# Patient Record
Sex: Male | Born: 1980 | Race: White | Hispanic: No | Marital: Single | Smoking: Current every day smoker
Health system: Southern US, Community
[De-identification: ages and names within clinical notes are randomized; demographics above are authoritative.]

## PROBLEM LIST (undated history)

## (undated) DIAGNOSIS — K859 Acute pancreatitis without necrosis or infection, unspecified: Secondary | ICD-10-CM

## (undated) DIAGNOSIS — M549 Dorsalgia, unspecified: Secondary | ICD-10-CM

## (undated) DIAGNOSIS — J45909 Unspecified asthma, uncomplicated: Secondary | ICD-10-CM

## (undated) DIAGNOSIS — G8929 Other chronic pain: Secondary | ICD-10-CM

## (undated) DIAGNOSIS — I499 Cardiac arrhythmia, unspecified: Secondary | ICD-10-CM

## (undated) DIAGNOSIS — K529 Noninfective gastroenteritis and colitis, unspecified: Secondary | ICD-10-CM

## (undated) HISTORY — DX: Other chronic pain: G89.29

## (undated) HISTORY — DX: Cardiac arrhythmia, unspecified: I49.9

## (undated) HISTORY — DX: Noninfective gastroenteritis and colitis, unspecified: K52.9

## (undated) HISTORY — DX: Dorsalgia, unspecified: M54.9

---

## 2000-09-30 HISTORY — PX: WISDOM TOOTH EXTRACTION: SHX21

## 2005-08-24 ENCOUNTER — Emergency Department: Payer: Self-pay | Admitting: Emergency Medicine

## 2005-08-27 ENCOUNTER — Emergency Department: Payer: Self-pay | Admitting: Emergency Medicine

## 2011-07-03 ENCOUNTER — Ambulatory Visit: Payer: Self-pay | Admitting: Gastroenterology

## 2011-08-07 ENCOUNTER — Ambulatory Visit: Payer: Self-pay | Admitting: Gastroenterology

## 2011-08-11 LAB — PATHOLOGY REPORT

## 2011-10-03 ENCOUNTER — Emergency Department: Payer: Self-pay | Admitting: Emergency Medicine

## 2011-11-07 ENCOUNTER — Ambulatory Visit: Payer: Self-pay | Admitting: Family Medicine

## 2011-11-11 ENCOUNTER — Other Ambulatory Visit: Payer: Self-pay | Admitting: Emergency Medicine

## 2011-11-11 LAB — LIPID PANEL
Cholesterol: 153 mg/dL (ref 0–200)
HDL Cholesterol: 30 mg/dL — ABNORMAL LOW (ref 40–60)
Ldl Cholesterol, Calc: 102 mg/dL — ABNORMAL HIGH (ref 0–100)
Triglycerides: 104 mg/dL (ref 0–200)
VLDL Cholesterol, Calc: 21 mg/dL (ref 5–40)

## 2011-11-12 LAB — CBC WITH DIFFERENTIAL/PLATELET
Basophil #: 0 10*3/uL (ref 0.0–0.1)
Basophil %: 0.5 %
Eosinophil #: 0.3 10*3/uL (ref 0.0–0.7)
Eosinophil %: 3.9 %
HCT: 46.1 % (ref 40.0–52.0)
HGB: 15.1 g/dL (ref 13.0–18.0)
Lymphocyte #: 2.7 10*3/uL (ref 1.0–3.6)
Lymphocyte %: 34.4 %
MCH: 29.8 pg (ref 26.0–34.0)
MCHC: 32.9 g/dL (ref 32.0–36.0)
MCV: 91 fL (ref 80–100)
Monocyte #: 0.4 10*3/uL (ref 0.0–0.7)
Monocyte %: 5.5 %
Neutrophil #: 4.4 10*3/uL (ref 1.4–6.5)
Neutrophil %: 55.7 %
Platelet: 171 10*3/uL (ref 150–440)
RBC: 5.09 10*6/uL (ref 4.40–5.90)
RDW: 13.9 % (ref 11.5–14.5)
WBC: 7.8 10*3/uL (ref 3.8–10.6)

## 2011-11-12 LAB — COMPREHENSIVE METABOLIC PANEL
Albumin: 4.3 g/dL (ref 3.4–5.0)
Alkaline Phosphatase: 60 U/L (ref 50–136)
Anion Gap: 7 (ref 7–16)
BUN: 13 mg/dL (ref 7–18)
Bilirubin,Total: 0.4 mg/dL (ref 0.2–1.0)
Calcium, Total: 9.1 mg/dL (ref 8.5–10.1)
Chloride: 106 mmol/L (ref 98–107)
Co2: 28 mmol/L (ref 21–32)
Creatinine: 1.17 mg/dL (ref 0.60–1.30)
EGFR (African American): >60
EGFR (Non-African Amer.): >60
Glucose: 83 mg/dL (ref 65–99)
Osmolality: 281 (ref 275–301)
Potassium: 4.3 mmol/L (ref 3.5–5.1)
SGOT(AST): 12 U/L — ABNORMAL LOW (ref 15–37)
SGPT (ALT): 16 U/L
Sodium: 141 mmol/L (ref 136–145)
Total Protein: 7.4 g/dL (ref 6.4–8.2)

## 2012-02-24 ENCOUNTER — Emergency Department: Payer: Self-pay | Admitting: Emergency Medicine

## 2012-03-11 ENCOUNTER — Emergency Department: Payer: Self-pay | Admitting: Emergency Medicine

## 2012-09-10 ENCOUNTER — Emergency Department: Payer: Self-pay | Admitting: Emergency Medicine

## 2012-11-05 ENCOUNTER — Ambulatory Visit: Payer: Self-pay | Admitting: Family

## 2012-12-29 ENCOUNTER — Other Ambulatory Visit: Payer: Self-pay | Admitting: Family

## 2013-01-31 ENCOUNTER — Emergency Department: Payer: Self-pay | Admitting: Internal Medicine

## 2013-02-03 ENCOUNTER — Ambulatory Visit: Payer: Self-pay | Admitting: Urgent Care

## 2013-02-03 LAB — COMPREHENSIVE METABOLIC PANEL
Alkaline Phosphatase: 60 U/L (ref 50–136)
Anion Gap: 6 — ABNORMAL LOW (ref 7–16)
BUN: 11 mg/dL (ref 7–18)
Calcium, Total: 8.2 mg/dL — ABNORMAL LOW (ref 8.5–10.1)
Co2: 32 mmol/L (ref 21–32)
Creatinine: 1.06 mg/dL (ref 0.60–1.30)
Glucose: 86 mg/dL (ref 65–99)
Potassium: 3.3 mmol/L — ABNORMAL LOW (ref 3.5–5.1)
SGOT(AST): 10 U/L — ABNORMAL LOW (ref 15–37)
SGPT (ALT): 18 U/L (ref 12–78)
Total Protein: 6.4 g/dL (ref 6.4–8.2)

## 2013-02-03 LAB — CBC WITH DIFFERENTIAL/PLATELET
Basophil #: 0.1 10*3/uL (ref 0.0–0.1)
Eosinophil #: 0.1 10*3/uL (ref 0.0–0.7)
Eosinophil %: 1 %
Lymphocyte #: 4.7 10*3/uL — ABNORMAL HIGH (ref 1.0–3.6)
MCH: 29.5 pg (ref 26.0–34.0)
MCHC: 33.2 g/dL (ref 32.0–36.0)
MCV: 89 fL (ref 80–100)
Monocyte #: 0.9 x10 3/mm (ref 0.2–1.0)
Neutrophil #: 6.2 10*3/uL (ref 1.4–6.5)
Neutrophil %: 51.7 %
Platelet: 177 10*3/uL (ref 150–440)
RBC: 4.58 10*6/uL (ref 4.40–5.90)
WBC: 12 10*3/uL — ABNORMAL HIGH (ref 3.8–10.6)

## 2013-02-03 LAB — LIPASE, BLOOD: Lipase: 63 U/L — ABNORMAL LOW (ref 73–393)

## 2013-02-11 ENCOUNTER — Ambulatory Visit: Payer: Self-pay | Admitting: Gastroenterology

## 2013-03-30 ENCOUNTER — Other Ambulatory Visit: Payer: Self-pay | Admitting: Gastroenterology

## 2013-04-01 ENCOUNTER — Ambulatory Visit: Payer: Self-pay | Admitting: Gastroenterology

## 2013-04-26 ENCOUNTER — Ambulatory Visit: Payer: Self-pay | Admitting: Surgery

## 2013-04-26 ENCOUNTER — Ambulatory Visit: Payer: Self-pay | Admitting: Family

## 2013-04-27 LAB — CLOSTRIDIUM DIFFICILE BY PCR

## 2013-04-28 LAB — WBCS, STOOL

## 2013-05-03 ENCOUNTER — Other Ambulatory Visit: Payer: Self-pay | Admitting: Physician Assistant

## 2013-05-06 ENCOUNTER — Ambulatory Visit: Payer: Self-pay | Admitting: Internal Medicine

## 2013-05-20 ENCOUNTER — Emergency Department: Payer: Self-pay | Admitting: Emergency Medicine

## 2013-05-20 LAB — CBC
HCT: 44 % (ref 40.0–52.0)
HGB: 15.3 g/dL (ref 13.0–18.0)
Platelet: 208 10*3/uL (ref 150–440)
RBC: 5 10*6/uL (ref 4.40–5.90)
WBC: 14.9 10*3/uL — ABNORMAL HIGH (ref 3.8–10.6)

## 2013-05-20 LAB — URINALYSIS, COMPLETE
Bacteria: NONE SEEN
Bilirubin,UR: NEGATIVE
Leukocyte Esterase: NEGATIVE
Nitrite: NEGATIVE
Ph: 6 (ref 4.5–8.0)
RBC,UR: <1 /HPF (ref 0–5)
Squamous Epithelial: <1

## 2013-05-20 LAB — BASIC METABOLIC PANEL
BUN: 9 mg/dL (ref 7–18)
Calcium, Total: 9.3 mg/dL (ref 8.5–10.1)
Co2: 25 mmol/L (ref 21–32)
Creatinine: 1.12 mg/dL (ref 0.60–1.30)
Glucose: 97 mg/dL (ref 65–99)
Sodium: 137 mmol/L (ref 136–145)

## 2013-06-10 IMAGING — CT CT ABD-PELV W/ CM
1 of 2 series · 15 of 32 positions shown, 19 images · non-contrast
Comparison: none

REASON FOR EXAM: abd pain and weight loss
COMMENTS:

[Series 2: abd with 5.0 i40f 3 · axial · 0.85mm/px · z∈[-588,-118]mm · 15 of 104 slices shown, 19 images]
[im 5/104  soft-tissue]
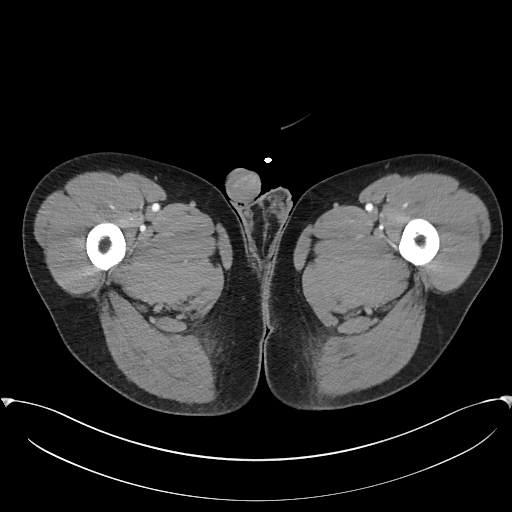
[im 5/104  bone]
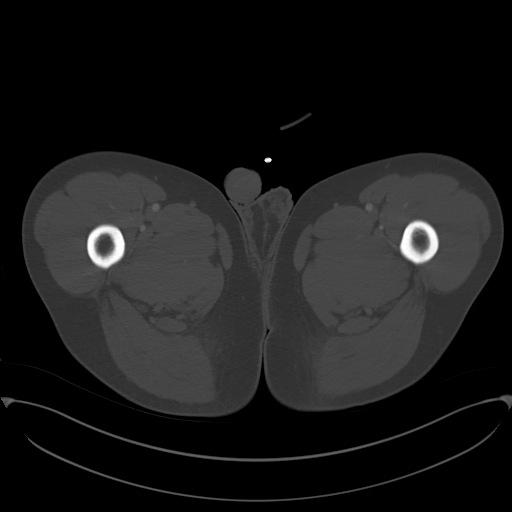
[im 13/104  soft-tissue]
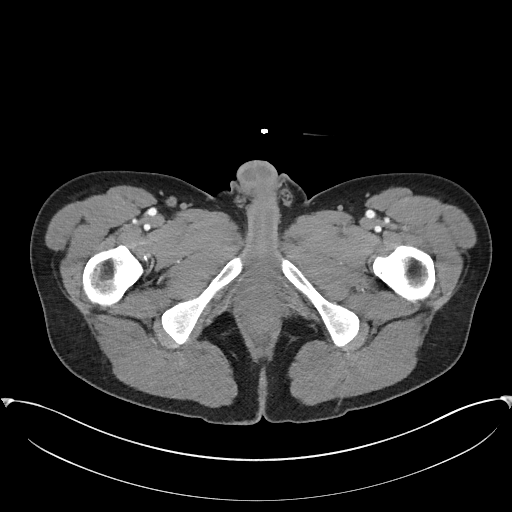
[im 22/104  soft-tissue]
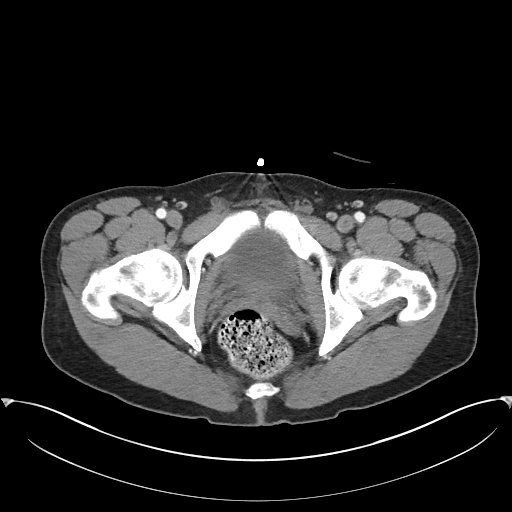
[im 31/104  soft-tissue]
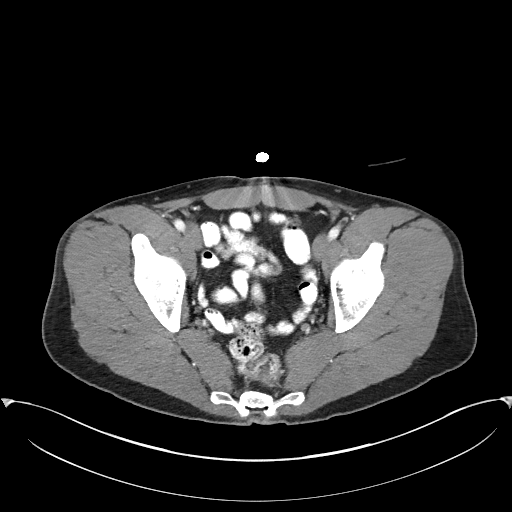
[im 35/104  soft-tissue]
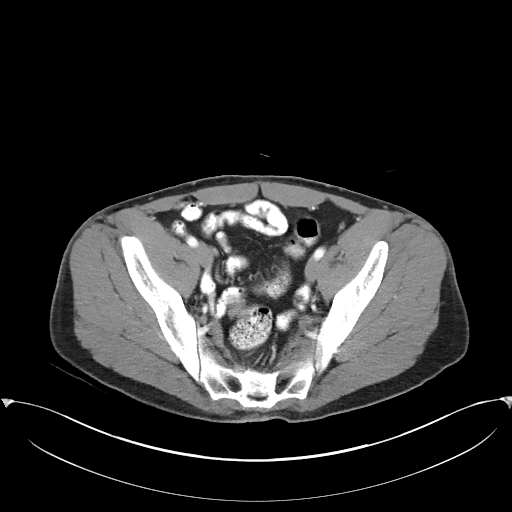
[im 43/104  soft-tissue]
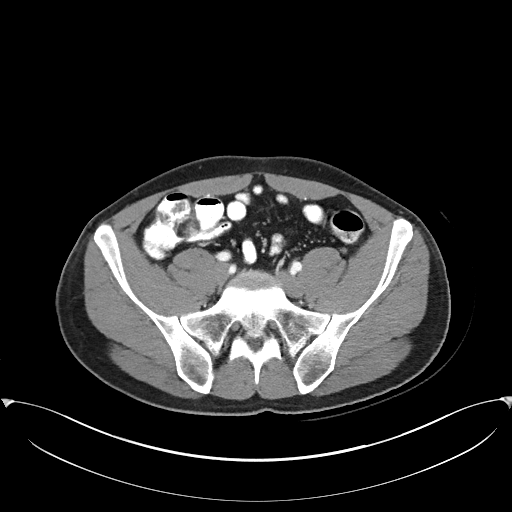
[im 52/104  soft-tissue]
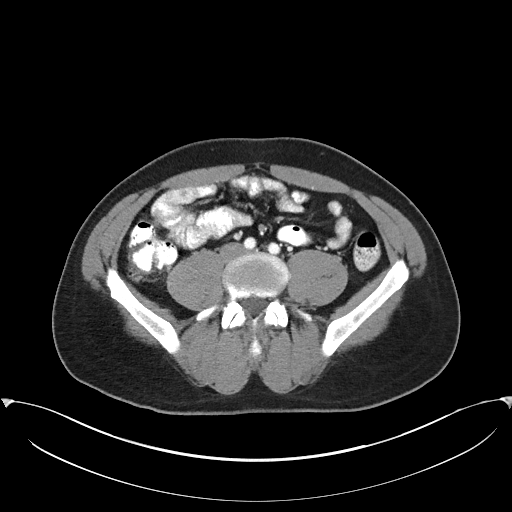
[im 61/104  soft-tissue]
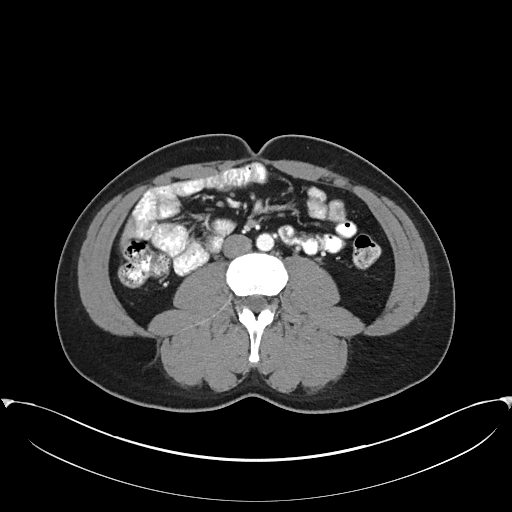
[im 69/104  soft-tissue]
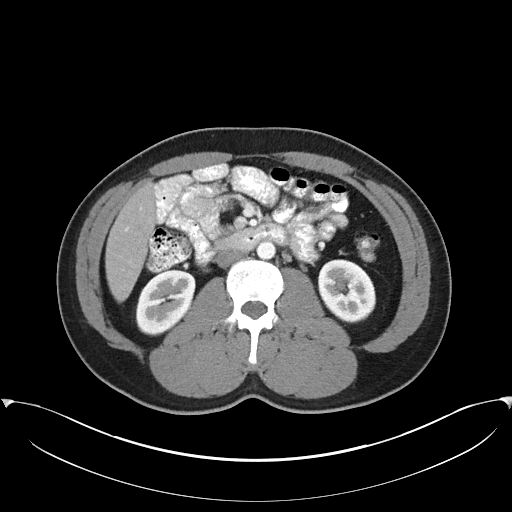
[im 69/104  bone]
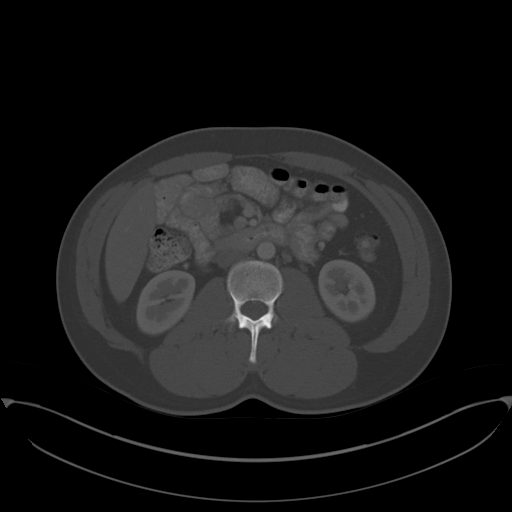
[im 73/104  soft-tissue]
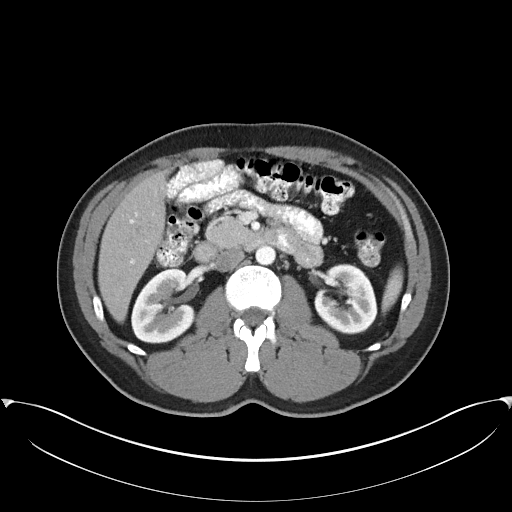
[im 82/104  soft-tissue]
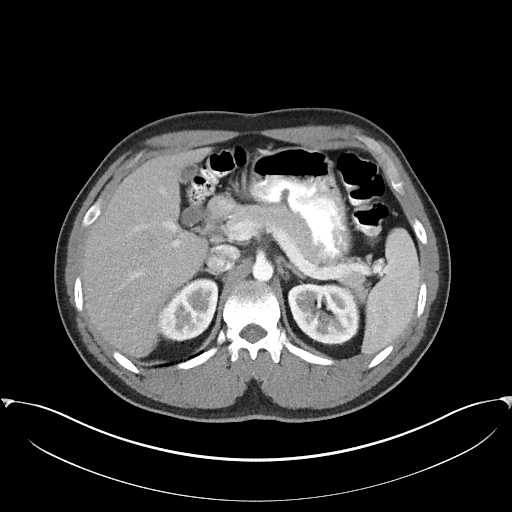
[im 86/104  lung]
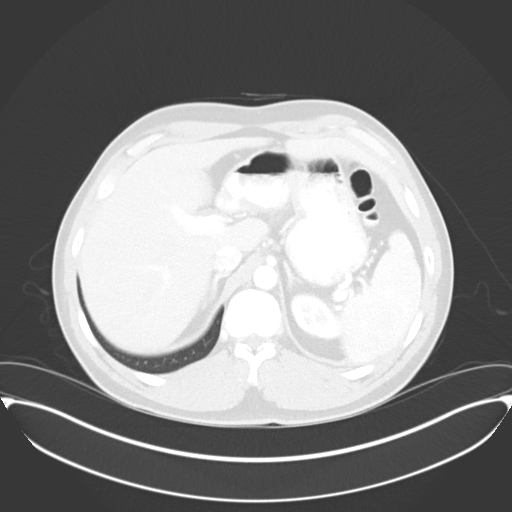
[im 91/104  soft-tissue]
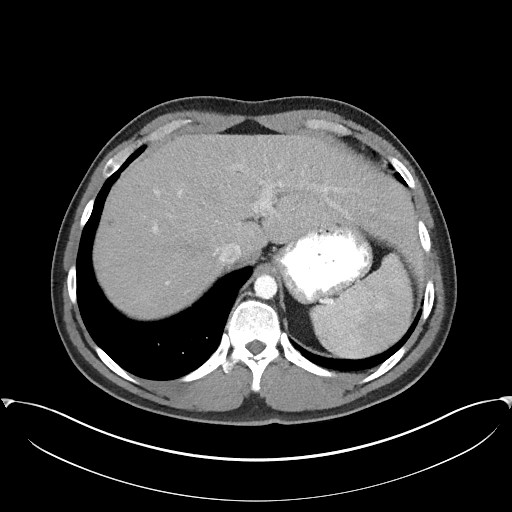
[im 91/104  lung]
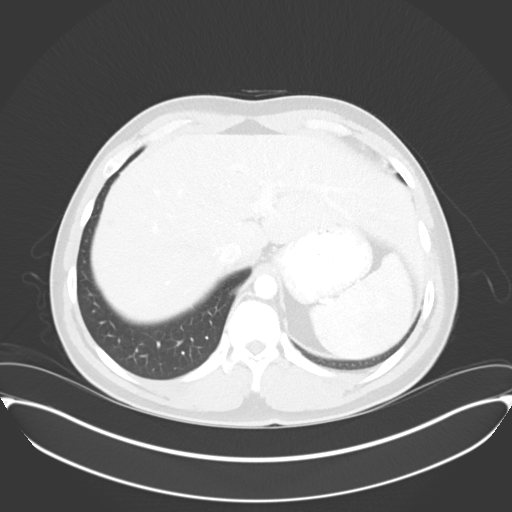
[im 95/104  lung]
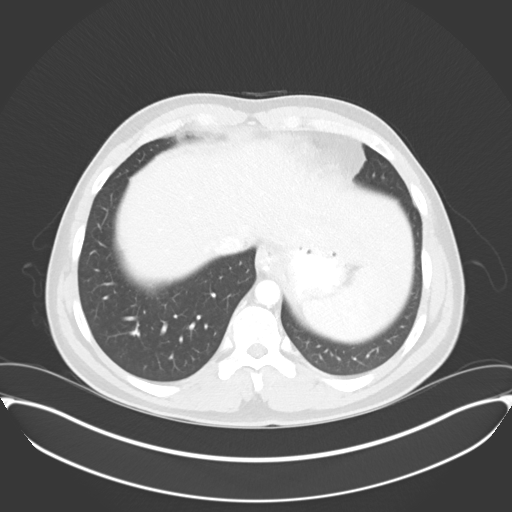
[im 99/104  soft-tissue]
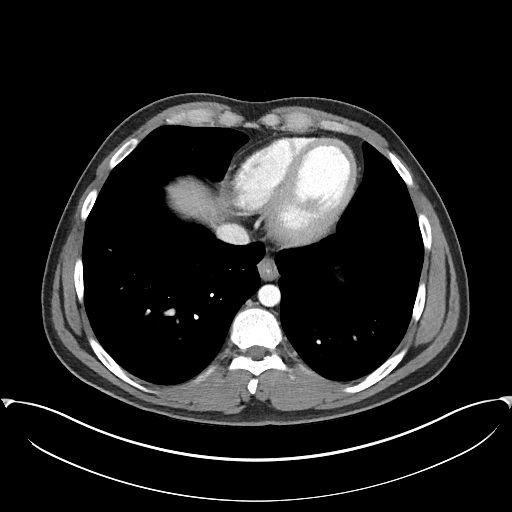
[im 99/104  lung]
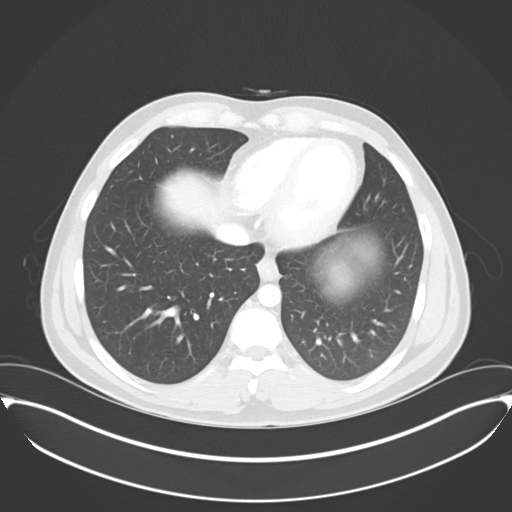

[15 of 32 positions shown; findings below may reference images not displayed]

PROCEDURE:     KCT - KCT ABDOMEN/PELVIS W  - July 03, 2011 [DATE]

RESULT:     CT of the abdomen and pelvis is performed with 100 mL of
[id number] iodinated intravenous contrast and oral contrast. Images are
reconstructed at 5.0 mm slice thickness in the axial plane. The patient has
no previous exam for comparison.

Images through the base of the lungs demonstrate normal aeration without a
discrete mass. There is no pleural or pericardial effusion. The liver,
spleen, pancreas, adrenal glands and kidneys appear to be grossly normal. No
definite gallstones are seen. The adrenal glands show no discrete mass. The
aorta is normal in caliber. The urinary bladder is unremarkable. The bowel
shows no abnormal distention to suggest obstruction. There is no ascites or
pneumoperitoneum. The bony structures appear grossly normal. No adenopathy
is evident.
IMPRESSION: 1.     No focal abdominal or pelvic abnormality. The pancreas is homogeneous
in attenuation without a well-defined mass.

## 2013-07-07 ENCOUNTER — Emergency Department: Payer: Self-pay | Admitting: Emergency Medicine

## 2013-07-07 LAB — COMPREHENSIVE METABOLIC PANEL
BUN: 10 mg/dL (ref 7–18)
Calcium, Total: 8.7 mg/dL (ref 8.5–10.1)
Chloride: 109 mmol/L — ABNORMAL HIGH (ref 98–107)
Co2: 27 mmol/L (ref 21–32)
Creatinine: 1.05 mg/dL (ref 0.60–1.30)
EGFR (African American): >60
EGFR (Non-African Amer.): >60
SGOT(AST): 10 U/L — ABNORMAL LOW (ref 15–37)
SGPT (ALT): 14 U/L (ref 12–78)
Total Protein: 6.5 g/dL (ref 6.4–8.2)

## 2013-07-07 LAB — CBC
MCHC: 34.9 g/dL (ref 32.0–36.0)
RBC: 4.49 10*6/uL (ref 4.40–5.90)

## 2013-07-27 ENCOUNTER — Ambulatory Visit: Payer: Self-pay | Admitting: Family

## 2015-03-20 ENCOUNTER — Encounter: Payer: Self-pay | Admitting: Urgent Care

## 2015-03-20 ENCOUNTER — Emergency Department
Admission: EM | Admit: 2015-03-20 | Discharge: 2015-03-21 | Disposition: A | Discharge status: Home / Self Care | Payer: Self-pay | Attending: Emergency Medicine | Admitting: Emergency Medicine

## 2015-03-20 DIAGNOSIS — L03115 Cellulitis of right lower limb: Secondary | ICD-10-CM | POA: Insufficient documentation

## 2015-03-20 HISTORY — DX: Unspecified asthma, uncomplicated: J45.909

## 2015-03-20 NOTE — ED Notes (Signed)
Patient presents with c/o RIGHT ankle wound. Reports that he "stabbed a rusty portion of metal" in ankle x 2 weeks ago. Area was healing until the last couple of days - area red with (+) purulent discharge.

## 2015-03-21 MED ORDER — CEPHALEXIN 500 MG PO CAPS
ORAL_CAPSULE | ORAL | Status: AC
  Filled 2015-03-21: qty 1

## 2015-03-21 MED ORDER — CEPHALEXIN 500 MG PO CAPS: 500.0000 mg | ORAL_CAPSULE | Freq: Two times a day (BID) | ORAL | Status: DC

## 2015-03-21 MED ORDER — CEPHALEXIN 500 MG PO CAPS
500.0000 mg | ORAL_CAPSULE | Freq: Once | ORAL | Status: AC
  Administered 2015-03-21: 500 mg via ORAL

## 2015-03-21 NOTE — ED Provider Notes (Signed)
[facility] Emergency Department Provider Note  ____________________________________________  Time seen: 12:04 AM  I have reviewed the triage vital signs and the nursing notes.   HISTORY  Chief Complaint Wound Infection      HPI Randall Shelton is a 34 y.o. male presents with nonhealing wound to the lateral portion of the right ankle 2 weeks. Patient states despite applying antibiotic ointment and peroxide is nonhealing     Past Medical History  Diagnosis Date  . Asthma     There are no active problems to display for this patient.   History reviewed. No pertinent past surgical history.  No current outpatient prescriptions on file.  Allergies Review of patient's allergies indicates no known allergies.  No family history on file.  Social History History  Substance Use Topics  . Smoking status: Never Smoker   . Smokeless tobacco: Not on file  . Alcohol Use: Yes    Review of Systems  Constitutional: Negative for fever. Eyes: Negative for visual changes. ENT: Negative for sore throat. Cardiovascular: Negative for chest pain. Respiratory: Negative for shortness of breath. Gastrointestinal: Negative for abdominal pain, vomiting and diarrhea. Genitourinary: Negative for dysuria. Musculoskeletal: Negative for back pain. Skin: Positive cellulitis right ankle Neurological: Negative for headaches, focal weakness or numbness.   10-point ROS otherwise negative.  ____________________________________________   PHYSICAL EXAM:  VITAL SIGNS: ED Triage Vitals  Enc Vitals Group     BP 03/20/15 2317 114/66 mmHg     Pulse Rate 03/20/15 2317 65     Resp 03/20/15 2317 16     Temp 03/20/15 2317 98 F (36.7 C)     Temp Source 03/20/15 2317 Oral     SpO2 03/20/15 2317 96 %     Weight 03/20/15 2317 180 lb (81.647 kg)     Height 03/20/15 2317  (1.854 m)     Head Cir --      Peak Flow --      Pain Score 03/20/15 2318 2     Pain Loc --       Pain Edu? --      Excl. in GC? --      Constitutional: Alert and oriented. Well appearing and in no distress. Eyes: Conjunctivae are normal. PERRL. Normal extraocular movements. ENT   Head: Normocephalic and atraumatic.   Nose: No congestion/rhinnorhea.   Mouth/Throat: Mucous membranes are moist.   Neck: No stridor. Hematological/Lymphatic/Immunilogical: No cervical lymphadenopathy. Cardiovascular: Normal rate, regular rhythm. Normal and symmetric distal pulses are present in all extremities. No murmurs, rubs, or gallops. Respiratory: Normal respiratory effort without tachypnea nor retractions. Breath sounds are clear and equal bilaterally. No wheezes/rales/rhonchi. Gastrointestinal: Soft and nontender. No distention. There is no CVA tenderness. Genitourinary: deferred Musculoskeletal: Nontender with normal range of motion in all extremities. No joint effusions.  No lower extremity tenderness nor edema. Neurologic:  Normal speech and language. No gross focal neurologic deficits are appreciated. Speech is normal.  Skin:  Skin is warm, dry and intact. No rash noted. Psychiatric: Mood and affect are normal. Speech and behavior are normal. Patient exhibits appropriate insight and judgment.       ____________________________________________   INITIAL IMPRESSION / ASSESSMENT AND PLAN / ED COURSE  Pertinent labs & imaging results that were available during my care of the patient were reviewed by me and considered in my medical decision making (see chart for details).    ____________________________________________   FINAL CLINICAL IMPRESSION(S) / ED DIAGNOSES  Final diagnoses:  Cellulitis of right ankle      Darci Current, MD 03/21/15 [id number]

## 2015-03-21 NOTE — Discharge Instructions (Signed)

## 2015-03-21 NOTE — ED Notes (Signed)
Pt ambulating independently w/ steady gait on d/c in no acute distress, A&Ox4. D/c instructions reviewed w/ pt and family - pt and family deny any further questions or concerns at present. Rx given x1  

## 2015-04-05 ENCOUNTER — Emergency Department
Admission: EM | Admit: 2015-04-05 | Discharge: 2015-04-05 | Disposition: A | Discharge status: Home / Self Care | Payer: Self-pay | Attending: Emergency Medicine | Admitting: Emergency Medicine

## 2015-04-05 ENCOUNTER — Encounter: Payer: Self-pay | Admitting: General Practice

## 2015-04-05 ENCOUNTER — Emergency Department: Payer: Self-pay

## 2015-04-05 DIAGNOSIS — R110 Nausea: Secondary | ICD-10-CM | POA: Insufficient documentation

## 2015-04-05 DIAGNOSIS — Z792 Long term (current) use of antibiotics: Secondary | ICD-10-CM | POA: Insufficient documentation

## 2015-04-05 DIAGNOSIS — K611 Rectal abscess: Secondary | ICD-10-CM | POA: Insufficient documentation

## 2015-04-05 DIAGNOSIS — Z720 Tobacco use: Secondary | ICD-10-CM | POA: Insufficient documentation

## 2015-04-05 HISTORY — DX: Acute pancreatitis without necrosis or infection, unspecified: K85.90

## 2015-04-05 LAB — URINALYSIS COMPLETE WITH MICROSCOPIC (ARMC ONLY)
BILIRUBIN URINE: NEGATIVE
Bacteria, UA: NONE SEEN
Glucose, UA: NEGATIVE mg/dL
HGB URINE DIPSTICK: NEGATIVE
KETONES UR: NEGATIVE mg/dL
LEUKOCYTES UA: NEGATIVE
Nitrite: NEGATIVE
PH: 7 (ref 5.0–8.0)
Protein, ur: NEGATIVE mg/dL
SQUAMOUS EPITHELIAL / LPF: NONE SEEN
Specific Gravity, Urine: 1.02 (ref 1.005–1.030)

## 2015-04-05 LAB — CBC WITH DIFFERENTIAL/PLATELET
Basophils Absolute: 0.1 10*3/uL (ref 0–0.1)
Basophils Relative: 1 %
Eosinophils Absolute: 0.5 10*3/uL (ref 0–0.7)
Eosinophils Relative: 4 %
HCT: 42.9 % (ref 40.0–52.0)
HEMOGLOBIN: 14.4 g/dL (ref 13.0–18.0)
Lymphocytes Relative: 19 %
Lymphs Abs: 2.6 10*3/uL (ref 1.0–3.6)
MCH: 29.8 pg (ref 26.0–34.0)
MCHC: 33.6 g/dL (ref 32.0–36.0)
MCV: 88.7 fL (ref 80.0–100.0)
MONOS PCT: 7 %
Monocytes Absolute: 1 10*3/uL (ref 0.2–1.0)
NEUTROS ABS: 10 10*3/uL — AB (ref 1.4–6.5)
NEUTROS PCT: 69 %
Platelets: 197 10*3/uL (ref 150–440)
RBC: 4.83 MIL/uL (ref 4.40–5.90)
RDW: 13.9 % (ref 11.5–14.5)
WBC: 14.2 10*3/uL — ABNORMAL HIGH (ref 3.8–10.6)

## 2015-04-05 LAB — COMPREHENSIVE METABOLIC PANEL
ALT: 9 U/L — ABNORMAL LOW (ref 17–63)
AST: 13 U/L — AB (ref 15–41)
Albumin: 4.1 g/dL (ref 3.5–5.0)
Alkaline Phosphatase: 58 U/L (ref 38–126)
Anion gap: 5 (ref 5–15)
BILIRUBIN TOTAL: 0.5 mg/dL (ref 0.3–1.2)
BUN: 17 mg/dL (ref 6–20)
CALCIUM: 8.1 mg/dL — AB (ref 8.9–10.3)
CO2: 24 mmol/L (ref 22–32)
CREATININE: 1.1 mg/dL (ref 0.61–1.24)
Chloride: 105 mmol/L (ref 101–111)
GFR calc non Af Amer: >60 mL/min (ref >60)
Glucose, Bld: 119 mg/dL — ABNORMAL HIGH (ref 65–99)
Potassium: 3.6 mmol/L (ref 3.5–5.1)
Sodium: 134 mmol/L — ABNORMAL LOW (ref 135–145)
Total Protein: 7.2 g/dL (ref 6.5–8.1)

## 2015-04-05 LAB — LIPASE, BLOOD: LIPASE: 39 U/L (ref 22–51)

## 2015-04-05 MED ORDER — IOHEXOL 240 MG/ML SOLN
25.0000 mL | Freq: Once | INTRAMUSCULAR | Status: AC | PRN
  Administered 2015-04-05: 25 mL via ORAL
  Filled 2015-04-05: qty 25

## 2015-04-05 MED ORDER — SODIUM CHLORIDE 0.9 % IV BOLUS (SEPSIS)
1000.0000 mL | Freq: Once | INTRAVENOUS | Status: AC
  Administered 2015-04-05: 1000 mL via INTRAVENOUS

## 2015-04-05 MED ORDER — MORPHINE SULFATE 4 MG/ML IJ SOLN
4.0000 mg | Freq: Once | INTRAMUSCULAR | Status: AC
  Administered 2015-04-05: 4 mg via INTRAVENOUS

## 2015-04-05 MED ORDER — CIPROFLOXACIN HCL 500 MG PO TABS: 500.0000 mg | ORAL_TABLET | Freq: Two times a day (BID) | ORAL | Status: DC

## 2015-04-05 MED ORDER — METRONIDAZOLE 500 MG PO TABS
ORAL_TABLET | ORAL | Status: AC
  Administered 2015-04-05: 500 mg via ORAL
  Filled 2015-04-05: qty 1

## 2015-04-05 MED ORDER — METRONIDAZOLE 500 MG PO TABS
500.0000 mg | ORAL_TABLET | Freq: Once | ORAL | Status: AC
  Administered 2015-04-05: 500 mg via ORAL

## 2015-04-05 MED ORDER — MORPHINE SULFATE 4 MG/ML IJ SOLN
INTRAMUSCULAR | Status: AC
  Administered 2015-04-05: 4 mg via INTRAVENOUS
  Filled 2015-04-05: qty 1

## 2015-04-05 MED ORDER — ONDANSETRON HCL 4 MG/2ML IJ SOLN
INTRAMUSCULAR | Status: AC
  Administered 2015-04-05: 4 mg via INTRAVENOUS
  Filled 2015-04-05: qty 2

## 2015-04-05 MED ORDER — CIPROFLOXACIN HCL 500 MG PO TABS
500.0000 mg | ORAL_TABLET | Freq: Once | ORAL | Status: AC
  Administered 2015-04-05: 500 mg via ORAL

## 2015-04-05 MED ORDER — METRONIDAZOLE 500 MG PO TABS: 500.0000 mg | ORAL_TABLET | Freq: Three times a day (TID) | ORAL | Status: AC

## 2015-04-05 MED ORDER — IOHEXOL 300 MG/ML  SOLN
100.0000 mL | Freq: Once | INTRAMUSCULAR | Status: AC | PRN
  Administered 2015-04-05: 100 mL via INTRAVENOUS
  Filled 2015-04-05: qty 100

## 2015-04-05 MED ORDER — CIPROFLOXACIN HCL 500 MG PO TABS
ORAL_TABLET | ORAL | Status: AC
  Administered 2015-04-05: 500 mg via ORAL
  Filled 2015-04-05: qty 1

## 2015-04-05 MED ORDER — IOHEXOL 240 MG/ML SOLN: 25.0000 mL | Freq: Once | INTRAMUSCULAR | Status: DC | PRN

## 2015-04-05 MED ORDER — ONDANSETRON HCL 4 MG/2ML IJ SOLN
4.0000 mg | Freq: Once | INTRAMUSCULAR | Status: AC
  Administered 2015-04-05: 4 mg via INTRAVENOUS

## 2015-04-05 NOTE — ED Notes (Signed)
Pt. Arrived to ed from home with reports of experiencing lower abdominal pain and nausea. Pt alert and oriented. Denies vomiting and diarrhea. Pt. Describes pain as cramping sensation.

## 2015-04-05 NOTE — H&P (Signed)
CC: Lower abdominal pain x 1 day  HPI: Mr. Lawerance CruelOgle is a pleasant 34 yo M who presents with 1 day of lower abdominal pain, R>L.  Began acutely.  Did not resolve.  No pain with BM.  + feels subjectively hot.  Feels better now since here.  No chills, night sweats, shortness of breath, cough, chest pain, nausea/vomiting, diarrhea/constipation, dysuria/hematuria.  Has CT which shows small perirectal abscess, nonsuperficial.  Active Ambulatory Problems    Diagnosis Date Noted  . No Active Ambulatory Problems   Resolved Ambulatory Problems    Diagnosis Date Noted  . No Resolved Ambulatory Problems   Past Medical History  Diagnosis Date  . Asthma   . Pancreatitis    History   Social History  . Marital Status: Single    Spouse Name: N/A  . Number of Children: N/A  . Years of Education: N/A   Occupational History  . Not on file.   Social History Main Topics  . Smoking status: Current Every Day Smoker -- 1.00 packs/day    Types: Cigarettes  . Smokeless tobacco: Never Used  . Alcohol Use: Yes  . Drug Use: Yes    Special: Marijuana  . Sexual Activity: Not on file   Other Topics Concern  . Not on file   Social History Narrative   Home Meds: None  No Known Allergies   ROS: Full ROS obtained, pertinent positives and negatives as above  Blood pressure 137/66, pulse 89, temperature 98.9 F (37.2 C), temperature source Oral, resp. rate 18, height 6\' 1"  (1.854 m), weight 81.647 kg (180 lb), SpO2 97 %. GEN: NAD/A&Ox3 FACE: no obvious facial trauma, normal external nose, normal external ears EYES: no scleral icterus, no conjunctivitis HEAD: normocephalic atraumatic CV: RRR, no MRG RESP: moving air well, lungs clear ABD: soft, nontender, nondistended RECTAL: No obvious rectal tenderness, masses, fluctuance noted on digital exam EXT: moving all ext well, strength 5/5 NEURO: cnII-XII grossly intact, sensation intact all 4 ext  Labs: reviewed, significant for WBC 14.2 with 69%  neutrophils  CT: small right perirectal abscess, ? Above levators  A/P 34 yo M who presents with lower abdominal pain, CT shows small perirectal abscess.  Pain significantly improved now.  Have given patient option of admission for IV abx vs admission for IV abx and rectal EUA with possible drainage vs PO abx and outpatient tx with close follow up and to call me if things worsen.  As he feels better, he would like to go home with attempt of outpatient therapy.

## 2015-04-05 NOTE — Discharge Instructions (Signed)
Peri-Rectal Abscess  Your caregiver has diagnosed you as having a peri-rectal abscess. This is an infected area near the rectum that is filled with pus. If the abscess is near the surface of the skin, your caregiver may open (incise) the area and drain the pus.  HOME CARE INSTRUCTIONS    If your abscess was opened up and drained. A small piece of gauze may be placed in the opening so that it can drain. Do not remove the gauze unless directed by your caregiver.   A loose dressing may be placed over the abscess site. Change the dressing as often as necessary to keep it clean and dry.   After the drain is removed, the area may be washed with a gentle antiseptic (soap) four times per day.   A warm sitz bath, warm packs or heating pad may be used for pain relief, taking care not to burn yourself.   Return for a wound check in 1 day or as directed.   An "inflatable doughnut" may be used for sitting with added comfort. These can be purchased at a drugstore or medical supply house.   To reduce pain and straining with bowel movements, eat a high fiber diet with plenty of fruits and vegetables. Use stool softeners as recommended by your caregiver. This is especially important if narcotic type pain medications were prescribed as these may cause marked constipation.   Only take over-the-counter or prescription medicines for pain, discomfort, or fever as directed by your caregiver.  SEEK IMMEDIATE MEDICAL CARE IF:    You have increasing pain that is not controlled by medication.   There is increased inflammation (redness), swelling, bleeding, or drainage from the area.   An oral temperature above 102 F (38.9 C) develops.   You develop chills or generalized malaise (feel lethargic or feel "washed out").   You develop any new symptoms (problems) you feel may be related to your present problem.  Document Released: 09/13/2000 Document Revised: 12/09/2011 Document Reviewed: 09/13/2008  [employer] Patient Information  2015 [employer]. This information is not intended to replace advice given to you by your health care provider. Make sure you discuss any questions you have with your health care provider.

## 2015-04-05 NOTE — ED Provider Notes (Signed)
[facility] Emergency Department Provider Note  ____________________________________________  Time seen: Approximately 7:48 PM  I have reviewed the triage vital signs and the nursing notes.   HISTORY  Chief Complaint Abdominal Pain and Nausea    HPI Randall Shelton is a 34 y.o. male with a history of pancreatitis presents with right lower quadrant abdominal pain which has now spread to the abdomen diffusely. He has been experienced nausea today as well. No diarrhea. No penile or testicular pain.No dysuria. Pain is moderate and cramping. No diarrhea. No recent sick contacts.   Past Medical History  Diagnosis Date  . Asthma   . Pancreatitis     There are no active problems to display for this patient.   History reviewed. No pertinent past surgical history.  Current Outpatient Rx  Name  Route  Sig  Dispense  Refill  . cephALEXin (KEFLEX) 500 MG capsule   Oral   Take 1 capsule (500 mg total) by mouth 2 (two) times daily.   14 capsule   0     Allergies Review of patient's allergies indicates no known allergies.  No family history on file.  Social History History  Substance Use Topics  . Smoking status: Current Every Day Smoker -- 1.00 packs/day    Types: Cigarettes  . Smokeless tobacco: Never Used  . Alcohol Use: Yes    Review of Systems Constitutional: No fever/chills Eyes: No visual changes. ENT: No sore throat. Cardiovascular: Denies chest pain. Respiratory: Denies shortness of breath. Gastrointestinal: no vomiting.  No diarrhea.  No constipation. Genitourinary: Negative for dysuria. Musculoskeletal: Negative for back pain. Skin: Negative for rash. Neurological: Negative for headaches, focal weakness or numbness.  10-point ROS otherwise negative.  ____________________________________________   PHYSICAL EXAM:  VITAL SIGNS: ED Triage Vitals  Enc Vitals Group     BP 04/05/15 1845 137/66 mmHg     Pulse Rate 04/05/15 1845 89      Resp 04/05/15 1845 18     Temp 04/05/15 1845 98.9 F (37.2 C)     Temp Source 04/05/15 1845 Oral     SpO2 04/05/15 1845 97 %     Weight 04/05/15 1845 180 lb (81.647 kg)     Height 04/05/15 1845  (1.854 m)     Head Cir --      Peak Flow --      Pain Score 04/05/15 1848 8     Pain Loc --      Pain Edu? --      Excl. in GC? --     Constitutional: Alert and oriented. Well appearing and in no acute distress. Eyes: Conjunctivae are normal. PERRL. EOMI. Head: Atraumatic. Nose: No congestion/rhinnorhea. Mouth/Throat: Mucous membranes are moist.  Oropharynx non-erythematous. Neck: No stridor.   Cardiovascular: Normal rate, regular rhythm. Grossly normal heart sounds.  Good peripheral circulation. Respiratory: Normal respiratory effort.  No retractions. Lungs CTAB. Gastrointestinal: Soft with pain worse in the right lower quadrant. No rebound or guarding. No distention. No abdominal bruits. No CVA tenderness. Rectal exam with ringlike mass 1-2 inches proximal to the anal verge. Mild fluctuance. It is tender to palpation over this lesion. Musculoskeletal: No lower extremity tenderness nor edema.  No joint effusions. Neurologic:  Normal speech and language. No gross focal neurologic deficits are appreciated. Speech is normal. No gait instability. Skin:  Skin is warm, dry and intact. No rash noted. Psychiatric: Mood and affect are normal. Speech and behavior are normal.  ____________________________________________   LABS (  all labs ordered are listed, but only abnormal results are displayed)  Labs Reviewed  COMPREHENSIVE METABOLIC PANEL - Abnormal; Notable for the following:    Sodium 134 (*)    Glucose, Bld 119 (*)    Calcium 8.1 (*)    AST 13 (*)    ALT 9 (*)    All other components within normal limits  CBC WITH DIFFERENTIAL/PLATELET - Abnormal; Notable for the following:    WBC 14.2 (*)    Neutro Abs 10.0 (*)    All other components within normal limits  URINALYSIS  COMPLETEWITH MICROSCOPIC ([facility] ONLY) - Abnormal; Notable for the following:    Color, Urine STRAW (*)    APPearance CLEAR (*)    All other components within normal limits  LIPASE, BLOOD   ____________________________________________  EKG   ____________________________________________  RADIOLOGY  Rim enhancing fluid collection along the right side of the rectum is most consistent with a perirectal abscess. ____________________________________________   PROCEDURES    ____________________________________________   INITIAL IMPRESSION / ASSESSMENT AND PLAN / ED COURSE  Pertinent labs & imaging results that were available during my care of the patient were reviewed by me and considered in my medical decision making (see chart for details).  ----------------------------------------- 11:09 PM on 04/05/2015 -----------------------------------------  Patient resting comfortably at this time after pain medication. Dr. Juliann PulseLundquist was in the emergency department to evaluate this patient and would like to do a trial of outpatient antibiotics. Discussed with the patient who agrees to this trial. Dr. Juliann PulseLundquist said that trends rectal drainage may result in a fistula and a trial of by mouth antibiotics may be advantageous to avoid complication. Patient knows to call tomorrow for follow-up with Dr. Juliann PulseLundquist in the office. After telling the patient that he had a perirectal abscess, the patient says that he has had multiple rectal abscesses but usually not internally. Says that he has had multiple externally which she has had incised and drained. Denies any IV drug use. Denies putting anything inside the rectum. ____________________________________________   FINAL CLINICAL IMPRESSION(S) / ED DIAGNOSES  Acute perirectal abscess. Initial visit.    Myrna Blazeravid Matthew Liliya Fullenwider, MD 04/05/15 [phone]

## 2016-10-21 ENCOUNTER — Emergency Department: Payer: Self-pay

## 2016-10-21 ENCOUNTER — Encounter: Payer: Self-pay | Admitting: *Deleted

## 2016-10-21 ENCOUNTER — Emergency Department
Admission: EM | Admit: 2016-10-21 | Discharge: 2016-10-21 | Disposition: A | Discharge status: Home / Self Care | Payer: Self-pay | Attending: Emergency Medicine | Admitting: Emergency Medicine

## 2016-10-21 DIAGNOSIS — F1290 Cannabis use, unspecified, uncomplicated: Secondary | ICD-10-CM | POA: Insufficient documentation

## 2016-10-21 DIAGNOSIS — J069 Acute upper respiratory infection, unspecified: Secondary | ICD-10-CM | POA: Insufficient documentation

## 2016-10-21 DIAGNOSIS — B9789 Other viral agents as the cause of diseases classified elsewhere: Secondary | ICD-10-CM

## 2016-10-21 DIAGNOSIS — J45909 Unspecified asthma, uncomplicated: Secondary | ICD-10-CM | POA: Insufficient documentation

## 2016-10-21 DIAGNOSIS — F17210 Nicotine dependence, cigarettes, uncomplicated: Secondary | ICD-10-CM | POA: Insufficient documentation

## 2016-10-21 LAB — INFLUENZA PANEL BY PCR (TYPE A & B)
Influenza A By PCR: NEGATIVE
Influenza B By PCR: NEGATIVE

## 2016-10-21 MED ORDER — FEXOFENADINE-PSEUDOEPHED ER 60-120 MG PO TB12: 1.0000 | ORAL_TABLET | Freq: Two times a day (BID) | ORAL | 0 refills | Status: DC

## 2016-10-21 MED ORDER — BENZONATATE 100 MG PO CAPS: 100.0000 mg | ORAL_CAPSULE | Freq: Three times a day (TID) | ORAL | 0 refills | Status: DC

## 2016-10-21 MED ORDER — IBUPROFEN 600 MG PO TABS: 600.0000 mg | ORAL_TABLET | Freq: Three times a day (TID) | ORAL | 0 refills | Status: DC | PRN

## 2016-10-21 NOTE — ED Provider Notes (Signed)
[facility] Emergency Department Provider Note   ____________________________________________   First MD Initiated Contact with Patient 10/21/16 [phone]     (approximate)  I have reviewed the triage vital signs and the nursing notes.   HISTORY  Chief Complaint Cough; Nasal Congestion; and Generalized Body Aches    HPI Randall Shelton is a [age] y.o. male patient complaining of body aches, cough, fever, nasal chest congestion for 2 days. Patient states symptoms worsened last night. Patient state nausea without vomiting or diarrhea. No palliative measures for this complaint. Patient rates pain as a 4/10. Patient describes pain as "achy". Patient has not taken a flu shot this season.   Past Medical History:  Diagnosis Date  . Asthma   . Pancreatitis     There are no active problems to display for this patient.   History reviewed. No pertinent surgical history.  Prior to Admission medications   Medication Sig Start Date End Date Taking? Authorizing Provider  benzonatate (TESSALON PERLES) 100 MG capsule Take 1 capsule (100 mg total) by mouth 3 (three) times daily. 10/21/16 10/21/17  Joni Reiningonald K Omega Slager, PA-C  cephALEXin (KEFLEX) 500 MG capsule Take 1 capsule (500 mg total) by mouth 2 (two) times daily. 03/21/15   Darci Currentandolph N Brown, MD  ciprofloxacin (CIPRO) 500 MG tablet Take 1 tablet (500 mg total) by mouth 2 (two) times daily. 04/05/15   Myrna Blazeravid Matthew Schaevitz, MD  fexofenadine-pseudoephedrine (ALLEGRA-D) 60-120 MG 12 hr tablet Take 1 tablet by mouth 2 (two) times daily. 10/21/16   Joni Reiningonald K Kailin Leu, PA-C  ibuprofen (ADVIL,MOTRIN) 600 MG tablet Take 1 tablet (600 mg total) by mouth every 8 (eight) hours as needed. 10/21/16   Joni Reiningonald K Haniah Penny, PA-C    Allergies Patient has no known allergies.  History reviewed. No pertinent family history.  Social History Social History  Substance Use Topics  . Smoking status: Current Every Day Smoker    Packs/day: 1.00    Types:  Cigarettes  . Smokeless tobacco: Never Used  . Alcohol use Yes    Review of Systems Constitutional: No fever/chills Eyes: No visual changes. ENT: No sore throat. Cardiovascular: Denies chest pain. Respiratory: Denies shortness of breath. Gastrointestinal: No abdominal pain.  No nausea, no vomiting.  No diarrhea.  No constipation. Genitourinary: Negative for dysuria. Musculoskeletal: Negative for back pain. Skin: Negative for rash. Neurological: Negative for headaches, focal weakness or numbness.    ____________________________________________   PHYSICAL EXAM:  VITAL SIGNS: ED Triage Vitals  Enc Vitals Group     BP 10/21/16 0934 (!) 133/55     Pulse Rate 10/21/16 0934 73     Resp 10/21/16 0934 20     Temp 10/21/16 0934 98 F (36.7 C)     Temp Source 10/21/16 0934 Oral     SpO2 10/21/16 0934 98 %     Weight 10/21/16 0839 180 lb (81.6 kg)     Height 10/21/16 0839 6\' 4"  (1.93 m)     Head Circumference --      Peak Flow --      Pain Score 10/21/16 0930 4     Pain Loc --      Pain Edu? --      Excl. in GC? --     Constitutional: Alert and oriented. Well appearing and in no acute distress. Eyes: Conjunctivae are normal. PERRL. EOMI. Head: Atraumatic. Nose:Edematous nasal turbinates clear rhinorrhea Mouth/Throat: Mucous membranes are moist.  Oropharynx non-erythematous. Postnasal drainage Neck: No stridor.  No  cervical spine tenderness to palpation. Hematological/Lymphatic/Immunilogical: No cervical lymphadenopathy. Cardiovascular: Normal rate, regular rhythm. Grossly normal heart sounds.  Good peripheral circulation. Respiratory: Normal respiratory effort.  No retractions. Lungs with rhonchi breath sounds. Gastrointestinal: Soft and nontender. No distention. No abdominal bruits. No CVA tenderness. Musculoskeletal: No lower extremity tenderness nor edema.  No joint effusions. Neurologic:  Normal speech and language. No gross focal neurologic deficits are appreciated. No  gait instability. Skin:  Skin is warm, dry and intact. No rash noted. Psychiatric: Mood and affect are normal. Speech and behavior are normal.  ____________________________________________   LABS (all labs ordered are listed, but only abnormal results are displayed)  Labs Reviewed  INFLUENZA PANEL BY PCR (TYPE A & B)   ____________________________________________  EKG   ____________________________________________  RADIOLOGY   ____________________________________________   PROCEDURES  Procedure(s) performed: None  Procedures  Critical Care performed: No  ____________________________________________   INITIAL IMPRESSION / ASSESSMENT AND PLAN / ED COURSE  Pertinent labs & imaging results that were available during my care of the patient were reviewed by me and considered in my medical decision making (see chart for details). Viral illness. Patient given discharge care instructions. Patient given prescription for fexofenadine, Tessalon Perles, and ibuprofen. Patient given a work note. Patient advised follow "clinic  condition persists       ____________________________________________   FINAL CLINICAL IMPRESSION(S) / ED DIAGNOSES  Final diagnoses:  Viral URI with cough      NEW MEDICATIONS STARTED DURING THIS VISIT:  New Prescriptions   BENZONATATE (TESSALON PERLES) 100 MG CAPSULE    Take 1 capsule (100 mg total) by mouth 3 (three) times daily.   FEXOFENADINE-PSEUDOEPHEDRINE (ALLEGRA-D) 60-120 MG 12 HR TABLET    Take 1 tablet by mouth 2 (two) times daily.   IBUPROFEN (ADVIL,MOTRIN) 600 MG TABLET    Take 1 tablet (600 mg total) by mouth every 8 (eight) hours as needed.     Note:  This document was prepared using Dragon voice recognition software and may include unintentional dictation errors.    Joni Reining, PA-C 10/21/16 1047    Rockne Menghini, MD 10/21/16 [phone]

## 2016-10-21 NOTE — ED Triage Notes (Signed)
Pt states generalized body aches, cough, fever, and congestion since Saturday

## 2016-10-21 NOTE — ED Notes (Signed)
See triage note  States he developed nasal congestion body aches and fever since friday

## 2016-10-23 ENCOUNTER — Emergency Department: Payer: Self-pay

## 2016-10-23 ENCOUNTER — Emergency Department
Admission: EM | Admit: 2016-10-23 | Discharge: 2016-10-23 | Disposition: A | Discharge status: Home / Self Care | Payer: Self-pay | Attending: Emergency Medicine | Admitting: Emergency Medicine

## 2016-10-23 ENCOUNTER — Encounter: Payer: Self-pay | Admitting: *Deleted

## 2016-10-23 DIAGNOSIS — Z791 Long term (current) use of non-steroidal anti-inflammatories (NSAID): Secondary | ICD-10-CM | POA: Insufficient documentation

## 2016-10-23 DIAGNOSIS — J45901 Unspecified asthma with (acute) exacerbation: Secondary | ICD-10-CM | POA: Insufficient documentation

## 2016-10-23 DIAGNOSIS — Z79899 Other long term (current) drug therapy: Secondary | ICD-10-CM | POA: Insufficient documentation

## 2016-10-23 DIAGNOSIS — F17210 Nicotine dependence, cigarettes, uncomplicated: Secondary | ICD-10-CM | POA: Insufficient documentation

## 2016-10-23 MED ORDER — PREDNISONE 10 MG PO TABS: 50.0000 mg | ORAL_TABLET | Freq: Every day | ORAL | 0 refills | Status: DC

## 2016-10-23 MED ORDER — ALBUTEROL SULFATE HFA 108 (90 BASE) MCG/ACT IN AERS: 2.0000 | INHALATION_SPRAY | RESPIRATORY_TRACT | 1 refills | Status: DC | PRN

## 2016-10-23 MED ORDER — IPRATROPIUM-ALBUTEROL 0.5-2.5 (3) MG/3ML IN SOLN
3.0000 mL | Freq: Once | RESPIRATORY_TRACT | Status: AC
  Administered 2016-10-23: 3 mL via RESPIRATORY_TRACT
  Filled 2016-10-23: qty 3

## 2016-10-23 MED ORDER — AZITHROMYCIN 250 MG PO TABS: ORAL_TABLET | ORAL | 0 refills | Status: DC

## 2016-10-23 NOTE — ED Provider Notes (Signed)
[facility] Emergency Department Provider Note  ____________________________________________  Time seen: Approximately 9:33 AM  I have reviewed the triage vital signs and the nursing notes.   HISTORY  Chief Complaint Cough   HPI Randall Shelton is a 36 y.o. male who presents to the emergency department for evaluation of wheezing, shortness of breath, lower back pain, and increasing cough. He has a significant past medical history for asthma. He states that he has been having to use his albuterol inhaler often throughout the day, but does not seem to be relieving his symptoms. He was evaluated here on 10/21/2016 and advised that he had a viral upper respiratory infection. He has been taking the cough suppressant as prescribed without relief.   Past Medical History:  Diagnosis Date  . Asthma   . Pancreatitis     There are no active problems to display for this patient.   History reviewed. No pertinent surgical history.  Prior to Admission medications   Medication Sig Start Date End Date Taking? Authorizing Provider  albuterol (PROVENTIL HFA;VENTOLIN HFA) 108 (90 Base) MCG/ACT inhaler Inhale 2 puffs into the lungs every 4 (four) hours as needed for wheezing or shortness of breath. 10/23/16   Audray Rumore B Kadin Canipe, FNP  azithromycin (ZITHROMAX Z-PAK) 250 MG tablet Take 2 tablets (500 mg) on  Day 1,  followed by 1 tablet (250 mg) once daily on Days 2 through 5. 10/23/16   Delsy Etzkorn B Jonne Rote, FNP  benzonatate (TESSALON PERLES) 100 MG capsule Take 1 capsule (100 mg total) by mouth 3 (three) times daily. 10/21/16 10/21/17  Joni Reiningonald K Smith, PA-C  fexofenadine-pseudoephedrine (ALLEGRA-D) 60-120 MG 12 hr tablet Take 1 tablet by mouth 2 (two) times daily. 10/21/16   Joni Reiningonald K Smith, PA-C  ibuprofen (ADVIL,MOTRIN) 600 MG tablet Take 1 tablet (600 mg total) by mouth every 8 (eight) hours as needed. 10/21/16   Joni Reiningonald K Smith, PA-C  predniSONE (DELTASONE) 10 MG tablet Take 5 tablets (50 mg  total) by mouth daily. 10/23/16   Chinita Pesterari B Kaivon Livesey, FNP    Allergies Patient has no known allergies.  History reviewed. No pertinent family history.  Social History Social History  Substance Use Topics  . Smoking status: Current Every Day Smoker    Packs/day: 1.00    Types: Cigarettes  . Smokeless tobacco: Never Used  . Alcohol use Yes    Review of Systems Constitutional: Negative for fever/chills ENT: Negative for sore throat. Cardiovascular: Denies chest pain. Respiratory: Positive shortness of breath. Positive for cough. Gastrointestinal: Negative for nausea,  no vomiting.  Negative for diarrhea.  Musculoskeletal: Negative for body aches Skin: Negative for rash. Neurological: Negative for headaches ____________________________________________   PHYSICAL EXAM:  VITAL SIGNS: ED Triage Vitals  Enc Vitals Group     BP 10/23/16 0917 118/74     Pulse Rate 10/23/16 0917 91     Resp 10/23/16 0917 18     Temp 10/23/16 0917 98.2 F (36.8 C)     Temp Source 10/23/16 0917 Oral     SpO2 10/23/16 0917 99 %     Weight 10/23/16 0917 180 lb (81.6 kg)     Height 10/23/16 0917 6\' 1"  (1.854 m)     Head Circumference --      Peak Flow --      Pain Score 10/23/16 0918 0     Pain Loc --      Pain Edu? --      Excl. in GC? --  Constitutional: Alert and oriented. Acutely ill appearing and in no acute distress. Eyes: Conjunctivae are normal. EOMI. Ears: Bilateral tympanic membranes are within normal limits. Nose: No congestion; no rhinnorhea. Mouth/Throat: Mucous membranes are moist.  Oropharynx mildly erythematous. Tonsils not visualized. Neck: No stridor.  Lymphatic: No cervical lymphadenopathy. Cardiovascular: Normal rate, regular rhythm. Grossly normal heart sounds.  Good peripheral circulation. Respiratory: Normal respiratory effort.  No retractions. Diminished throughout with harsh rhonchi noted bilateral bases and faint expiratory wheeze scattered  throughout. Gastrointestinal: Soft and nontender.  Musculoskeletal: FROM x 4 extremities.  Neurologic:  Normal speech and language.  Skin:  Skin is warm, dry and intact. No rash noted. Psychiatric: Mood and affect are normal. Speech and behavior are normal.  ____________________________________________   LABS (all labs ordered are listed, but only abnormal results are displayed)  Labs Reviewed - No data to display ____________________________________________  EKG   ____________________________________________  RADIOLOGY  Chest x-ray negative for acute cardiopulmonary abnormality per radiology. ____________________________________________   PROCEDURES  Procedure(s) performed: None  Critical Care performed: No  ____________________________________________   INITIAL IMPRESSION / ASSESSMENT AND PLAN / ED COURSE     Pertinent labs & imaging results that were available during my care of the patient were reviewed by me and considered in my medical decision making (see chart for details).   36 year old male presenting to the emergency department for a second evaluation of cough and wheezing. While in the emergency department stay, he received a DuoNeb treatment with some relief. Chest x-ray is negative for acute findings per radiologist. He will be treated with prednisone, azithromycin, and given a refill of his albuterol inhaler. He is instructed to follow-up with primary care provider if his choice for symptoms that are not improving over the next few days. He was advised to return to the emergency department for symptoms that change or worsen if he is unable schedule an appointment. ____________________________________________   FINAL CLINICAL IMPRESSION(S) / ED DIAGNOSES  Final diagnoses:  Moderate asthma with exacerbation, unspecified whether persistent    Note:  This document was prepared using Dragon voice recognition software and may include unintentional  dictation errors.     Chinita Pester, FNP 10/23/16 1535    Arnaldo Natal, MD 10/23/16 [phone]

## 2016-10-23 NOTE — ED Notes (Signed)
See triage note  States he was seen on Monday but states sx's are getting worse  Feels SOB and having lower back pain  Also increased cough

## 2016-10-23 NOTE — ED Triage Notes (Addendum)
States he was diagnosed with URI infection on Monday, states he still does not feel better, awake and alert in no acute distress, cough present, wheezing left lung, smoker

## 2017-06-05 ENCOUNTER — Encounter: Payer: Self-pay | Admitting: Emergency Medicine

## 2017-06-05 ENCOUNTER — Emergency Department
Admission: EM | Admit: 2017-06-05 | Discharge: 2017-06-06 | Disposition: A | Discharge status: Home / Self Care | Payer: Self-pay | Attending: Emergency Medicine | Admitting: Emergency Medicine

## 2017-06-05 DIAGNOSIS — Z79899 Other long term (current) drug therapy: Secondary | ICD-10-CM | POA: Insufficient documentation

## 2017-06-05 DIAGNOSIS — K529 Noninfective gastroenteritis and colitis, unspecified: Secondary | ICD-10-CM

## 2017-06-05 DIAGNOSIS — J45901 Unspecified asthma with (acute) exacerbation: Secondary | ICD-10-CM | POA: Insufficient documentation

## 2017-06-05 DIAGNOSIS — J45909 Unspecified asthma, uncomplicated: Secondary | ICD-10-CM | POA: Insufficient documentation

## 2017-06-05 DIAGNOSIS — F17210 Nicotine dependence, cigarettes, uncomplicated: Secondary | ICD-10-CM | POA: Insufficient documentation

## 2017-06-05 DIAGNOSIS — K5282 Eosinophilic colitis: Secondary | ICD-10-CM | POA: Insufficient documentation

## 2017-06-05 LAB — URINALYSIS, COMPLETE (UACMP) WITH MICROSCOPIC
BILIRUBIN URINE: NEGATIVE
GLUCOSE, UA: NEGATIVE mg/dL
Hgb urine dipstick: NEGATIVE
KETONES UR: 5 mg/dL — AB
LEUKOCYTES UA: NEGATIVE
Nitrite: NEGATIVE
PH: 5 (ref 5.0–8.0)
Protein, ur: 30 mg/dL — AB
SPECIFIC GRAVITY, URINE: 1.032 — AB (ref 1.005–1.030)

## 2017-06-05 LAB — CBC
HCT: 40 % (ref 40.0–52.0)
Hemoglobin: 14 g/dL (ref 13.0–18.0)
MCH: 30.4 pg (ref 26.0–34.0)
MCHC: 35.1 g/dL (ref 32.0–36.0)
MCV: 86.7 fL (ref 80.0–100.0)
PLATELETS: 236 10*3/uL (ref 150–440)
RBC: 4.61 MIL/uL (ref 4.40–5.90)
RDW: 13.7 % (ref 11.5–14.5)
WBC: 15.2 10*3/uL — AB (ref 3.8–10.6)

## 2017-06-05 LAB — COMPREHENSIVE METABOLIC PANEL
ALK PHOS: 83 U/L (ref 38–126)
ALT: 21 U/L (ref 17–63)
AST: 22 U/L (ref 15–41)
Albumin: 4 g/dL (ref 3.5–5.0)
Anion gap: 7 (ref 5–15)
BILIRUBIN TOTAL: 0.2 mg/dL — AB (ref 0.3–1.2)
BUN: 14 mg/dL (ref 6–20)
CALCIUM: 8.7 mg/dL — AB (ref 8.9–10.3)
CO2: 27 mmol/L (ref 22–32)
CREATININE: 1.38 mg/dL — AB (ref 0.61–1.24)
Chloride: 106 mmol/L (ref 101–111)
Glucose, Bld: 102 mg/dL — ABNORMAL HIGH (ref 65–99)
Potassium: 3.9 mmol/L (ref 3.5–5.1)
Sodium: 140 mmol/L (ref 135–145)
Total Protein: 6.9 g/dL (ref 6.5–8.1)

## 2017-06-05 LAB — LIPASE, BLOOD: Lipase: 22 U/L (ref 11–51)

## 2017-06-05 NOTE — ED Triage Notes (Signed)
Patient with complaint of right lower abdominal pain that started this morning and has become worse throughout the day. Patient also with complaint of cough times 6 months.

## 2017-06-06 ENCOUNTER — Encounter: Payer: Self-pay | Admitting: Radiology

## 2017-06-06 ENCOUNTER — Emergency Department: Payer: Self-pay

## 2017-06-06 MED ORDER — ALBUTEROL SULFATE (2.5 MG/3ML) 0.083% IN NEBU: 2.5000 mg | INHALATION_SOLUTION | Freq: Four times a day (QID) | RESPIRATORY_TRACT | 12 refills | Status: DC | PRN

## 2017-06-06 MED ORDER — MORPHINE SULFATE (PF) 4 MG/ML IV SOLN
4.0000 mg | Freq: Once | INTRAVENOUS | Status: AC
  Administered 2017-06-06: 4 mg via INTRAVENOUS
  Filled 2017-06-06: qty 1

## 2017-06-06 MED ORDER — IOPAMIDOL (ISOVUE-300) INJECTION 61%
100.0000 mL | Freq: Once | INTRAVENOUS | Status: AC | PRN
  Administered 2017-06-06: 100 mL via INTRAVENOUS

## 2017-06-06 MED ORDER — METRONIDAZOLE 500 MG PO TABS: 500.0000 mg | ORAL_TABLET | Freq: Three times a day (TID) | ORAL | 0 refills | Status: AC

## 2017-06-06 MED ORDER — SODIUM CHLORIDE 0.9 % IV BOLUS (SEPSIS)
1000.0000 mL | Freq: Once | INTRAVENOUS | Status: AC
  Administered 2017-06-06: 1000 mL via INTRAVENOUS

## 2017-06-06 MED ORDER — IPRATROPIUM-ALBUTEROL 0.5-2.5 (3) MG/3ML IN SOLN
3.0000 mL | Freq: Once | RESPIRATORY_TRACT | Status: AC
  Administered 2017-06-06: 3 mL via RESPIRATORY_TRACT
  Filled 2017-06-06: qty 3

## 2017-06-06 MED ORDER — AMOXICILLIN-POT CLAVULANATE 875-125 MG PO TABS: 1.0000 | ORAL_TABLET | Freq: Two times a day (BID) | ORAL | 0 refills | Status: AC

## 2017-06-06 MED ORDER — SPACER/AERO CHAMBER MOUTHPIECE MISC: 1.0000 [IU] | 0 refills | Status: DC | PRN

## 2017-06-06 MED ORDER — ONDANSETRON HCL 4 MG/2ML IJ SOLN
4.0000 mg | Freq: Once | INTRAMUSCULAR | Status: AC
  Administered 2017-06-06: 4 mg via INTRAVENOUS
  Filled 2017-06-06: qty 2

## 2017-06-06 MED ORDER — METRONIDAZOLE 500 MG PO TABS
500.0000 mg | ORAL_TABLET | Freq: Once | ORAL | Status: AC
  Administered 2017-06-06: 500 mg via ORAL
  Filled 2017-06-06: qty 1

## 2017-06-06 MED ORDER — ALBUTEROL SULFATE HFA 108 (90 BASE) MCG/ACT IN AERS: 2.0000 | INHALATION_SPRAY | Freq: Four times a day (QID) | RESPIRATORY_TRACT | 0 refills | Status: DC | PRN

## 2017-06-06 MED ORDER — AMOXICILLIN-POT CLAVULANATE 875-125 MG PO TABS
1.0000 | ORAL_TABLET | Freq: Once | ORAL | Status: AC
  Administered 2017-06-06: 1 via ORAL
  Filled 2017-06-06: qty 1

## 2017-06-06 MED ORDER — METHYLPREDNISOLONE SODIUM SUCC 125 MG IJ SOLR
125.0000 mg | Freq: Once | INTRAMUSCULAR | Status: AC
  Administered 2017-06-06: 125 mg via INTRAVENOUS
  Filled 2017-06-06: qty 2

## 2017-06-06 NOTE — ED Notes (Signed)
Patient transported to CT 

## 2017-06-06 NOTE — Discharge Instructions (Signed)
Please take all of your antibiotics as prescribed and make an appointment to follow-up with the general surgeon this coming Monday for reevaluation. Return to the emergency department sooner for any new or worsening symptoms such as fevers, chills, worsening pain, or for any other issues whatsoever.  It was a pleasure to take care of you today, and thank you for coming to our emergency department.  If you have any questions or concerns before leaving please ask the nurse to grab me and I'm more than happy to go through your aftercare instructions again.  If you were prescribed any opioid pain medication today such as Norco, Vicodin, Percocet, morphine, hydrocodone, or oxycodone please make sure you do not drive when you are taking this medication as it can alter your ability to drive safely.  If you have any concerns once you are home that you are not improving or are in fact getting worse before you can make it to your follow-up appointment, please do not hesitate to call [phone] and come back for further evaluation.  Merrily BrittleNeil Mallika Sanmiguel, MD  Results for orders placed or performed during the hospital encounter of 06/05/17  Lipase, blood  Result Value Ref Range   Lipase 22 11 - 51 U/L  Comprehensive metabolic panel  Result Value Ref Range   Sodium 140 135 - 145 mmol/L   Potassium 3.9 3.5 - 5.1 mmol/L   Chloride 106 101 - 111 mmol/L   CO2 27 22 - 32 mmol/L   Glucose, Bld 102 (H) 65 - 99 mg/dL   BUN 14 6 - 20 mg/dL   Creatinine, Ser 5.621.38 (H) 0.61 - 1.24 mg/dL   Calcium 8.7 (L) 8.9 - 10.3 mg/dL   Total Protein 6.9 6.5 - 8.1 g/dL   Albumin 4.0 3.5 - 5.0 g/dL   AST 22 15 - 41 U/L   ALT 21 17 - 63 U/L   Alkaline Phosphatase 83 38 - 126 U/L   Total Bilirubin 0.2 (L) 0.3 - 1.2 mg/dL   GFR calc non Af Amer >60 >60 mL/min   GFR calc Af Amer >60 >60 mL/min   Anion gap 7 5 - 15  CBC  Result Value Ref Range   WBC 15.2 (H) 3.8 - 10.6 K/uL   RBC 4.61 4.40 - 5.90 MIL/uL   Hemoglobin 14.0 13.0 - 18.0 g/dL    HCT 13.040.0 86.540.0 - 78.452.0 %   MCV 86.7 80.0 - 100.0 fL   MCH 30.4 26.0 - 34.0 pg   MCHC 35.1 32.0 - 36.0 g/dL   RDW 69.613.7 29.511.5 - 28.414.5 %   Platelets 236 150 - 440 K/uL  Urinalysis, Complete w Microscopic  Result Value Ref Range   Color, Urine YELLOW (A) YELLOW   APPearance HAZY (A) CLEAR   Specific Gravity, Urine 1.032 (H) 1.005 - 1.030   pH 5.0 5.0 - 8.0   Glucose, UA NEGATIVE NEGATIVE mg/dL   Hgb urine dipstick NEGATIVE NEGATIVE   Bilirubin Urine NEGATIVE NEGATIVE   Ketones, ur 5 (A) NEGATIVE mg/dL   Protein, ur 30 (A) NEGATIVE mg/dL   Nitrite NEGATIVE NEGATIVE   Leukocytes, UA NEGATIVE NEGATIVE   RBC / HPF 0-5 0 - 5 RBC/hpf   WBC, UA 0-5 0 - 5 WBC/hpf   Bacteria, UA RARE (A) NONE SEEN   Squamous Epithelial / LPF 0-5 (A) NONE SEEN   Mucus PRESENT    Dg Chest 1 View  Result Date: 06/06/2017 CLINICAL DATA:  Cough and shortness of breath. Right lower  abdominal pain starting this morning. Cough for 6 months. EXAM: CHEST 1 VIEW COMPARISON:  10/23/2016 FINDINGS: The heart size and mediastinal contours are within normal limits. Both lungs are clear. The visualized skeletal structures are unremarkable. IMPRESSION: No active disease. Electronically Signed   By: Burman Nieves M.D.   On: 06/06/2017 00:47   Ct Abdomen Pelvis W Contrast  Result Date: 06/06/2017 CLINICAL DATA:  Abdominal infection. Left lower quadrant pain. Bloody stool. EXAM: CT ABDOMEN AND PELVIS WITH CONTRAST TECHNIQUE: Multidetector CT imaging of the abdomen and pelvis was performed using the standard protocol following bolus administration of intravenous contrast. CONTRAST:  ISOVUE-300 IOPAMIDOL (ISOVUE-300) INJECTION 61% COMPARISON:  CT 04/05/2015 FINDINGS: Lower chest: The lung bases are clear. Hepatobiliary: Subcentimeter low-density lesions in the periphery of the right lobe of the liver, unchanged from prior exam. No suspicious hepatic lesion. Gallbladder partially distended, no calcified stone. No biliary dilatation.  Pancreas: No ductal dilatation or inflammation. Spleen: Normal in size without focal abnormality. Adrenals/Urinary Tract: Adrenal glands are unremarkable. Kidneys are normal, without renal calculi, focal lesion, or hydronephrosis. Bladder is unremarkable. Stomach/Bowel: Short segment wall thickening of the distal sigmoid colon and rectum with mild adjacent soft tissue edema. There is a 10 x 9 mm right perirectal area of low density consistent with phlegmon, no definite fluid component. No additional small or large bowel inflammation. Moderate colonic stool burden. Normal appendix. No small bowel wall thickening or abnormal distention. Mild distal esophageal wall thickening. Vascular/Lymphatic: Mild bi-iliac atherosclerosis. Normal caliber abdominal aorta. No abdominal or pelvic adenopathy. Reproductive: Prostate is unremarkable. Other: No free air or ascites. Musculoskeletal: Disc space narrowing and endplate spurring at L3-L4. There are no acute or suspicious osseous abnormalities. IMPRESSION: 1. Rectosigmoid wall thickening and inflammation consistent with proctocolitis. Right perirectal phlegmonous changes are subcentimeter, no definite abscess. 2. Mild distal esophageal wall thickening can be seen with reflux or esophagitis. 3. Mild bi-iliac atherosclerosis, age advanced. Electronically Signed   By: Rubye Oaks M.D.   On: 06/06/2017 01:04

## 2017-06-06 NOTE — ED Provider Notes (Signed)
[facility] Emergency Department Provider Note  ____________________________________________   First MD Initiated Contact with Patient 06/06/17 0019     (approximate)  I have reviewed the triage vital signs and the nursing notes.   HISTORY  Chief Complaint Abdominal Pain and Cough    HPI Randall Shelton is a 36 y.o. male who comes to the emergency department with 2 issues. First around 10 AM today he noted gradual onset progressive moderate severity cramping lower abdominal discomfort worse on the right than the left. Pain is been constant and seems to be worse when defecating and nothing seems to make it better. He is able to eat and drink. No nausea or vomiting. He has no history of abdominal surgeries. While here in the waiting room he attempted to have a bowel movement but no feces came out only blood-tinged mucus. 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 He also has a history of asthma which is untreated and has had shortness of breath and a dry cough for the past several weeks or so.   Past Medical History:  Diagnosis Date  . Asthma   . Pancreatitis     There are no active problems to display for this patient.   No past surgical history on file.  Prior to Admission medications   Medication Sig Start Date End Date Taking? Authorizing Provider  albuterol (PROVENTIL HFA;VENTOLIN HFA) 108 (90 Base) MCG/ACT inhaler Inhale 2 puffs into the lungs every 6 (six) hours as needed for wheezing or shortness of breath. 06/06/17   Merrily Brittle, MD  albuterol (PROVENTIL) (2.5 MG/3ML) 0.083% nebulizer solution Take 3 mLs (2.5 mg total) by nebulization every 6 (six) hours as needed for wheezing or shortness of breath. 06/06/17   Merrily Brittle, MD  amoxicillin-clavulanate (AUGMENTIN) 875-125 MG tablet Take 1 tablet by mouth 2 (two) times daily. 06/06/17 06/20/17  Merrily Brittle, MD  azithromycin (ZITHROMAX Z-PAK) 250  MG tablet Take 2 tablets (500 mg) on  Day 1,  followed by 1 tablet (250 mg) once daily on Days 2 through 5. 10/23/16   Triplett, Cari B, FNP  benzonatate (TESSALON PERLES) 100 MG capsule Take 1 capsule (100 mg total) by mouth 3 (three) times daily. 10/21/16 10/21/17  Joni Reining, PA-C  fexofenadine-pseudoephedrine (ALLEGRA-D) 60-120 MG 12 hr tablet Take 1 tablet by mouth 2 (two) times daily. 10/21/16   Joni Reining, PA-C  ibuprofen (ADVIL,MOTRIN) 600 MG tablet Take 1 tablet (600 mg total) by mouth every 8 (eight) hours as needed. 10/21/16   Joni Reining, PA-C  metroNIDAZOLE (FLAGYL) 500 MG tablet Take 1 tablet (500 mg total) by mouth 3 (three) times daily. 06/06/17 06/20/17  Merrily Brittle, MD  predniSONE (DELTASONE) 10 MG tablet Take 5 tablets (50 mg total) by mouth daily. 10/23/16   Chinita Pester, FNP  Spacer/Aero Chamber Mouthpiece MISC 1 Units by Does not apply route every 4 (four) hours as needed (wheezing). 06/06/17   Merrily Brittle, MD    Allergies Patient has no allergy information on record.  No family history on file.  Social History Social History  Substance Use Topics  . Smoking status: Current Every Day Smoker    Packs/day: 1.00    Types: Cigarettes  . Smokeless tobacco: Never Used  . Alcohol use No    Review of Systems Constitutional: No fever/chills Eyes: No visual changes. ENT: No sore throat. Cardiovascular: Denies chest pain. Respiratory: positive  shortness of breath. Gastrointestinal: positive abdominal pain.  No nausea, no vomiting.  No diarrhea.  No constipation. Genitourinary: Negative for dysuria. Musculoskeletal: Negative for back pain. Skin: Negative for rash. Neurological: Negative for headaches, focal weakness or numbness.   ____________________________________________   PHYSICAL EXAM:  VITAL SIGNS: ED Triage Vitals  Enc Vitals Group     BP 06/05/17 2202 127/75     Pulse Rate 06/05/17 2202 67     Resp 06/05/17 2202 18     Temp 06/05/17  2202 98.9 F (37.2 C)     Temp Source 06/05/17 2202 Oral     SpO2 06/05/17 2202 98 %     Weight 06/05/17 2202 180 lb (81.6 kg)     Height 06/05/17 2202 6\' 1"  (1.854 m)     Head Circumference --      Peak Flow --      Pain Score 06/05/17 2201 5     Pain Loc --      Pain Edu? --      Excl. in GC? --     Constitutional: alert and oriented 4 well appearing nontoxic no diaphoresis speaks in full clear sentences Eyes: PERRL EOMI. Head: Atraumatic. Nose: No congestion/rhinnorhea. Mouth/Throat: No trismus Neck: No stridor.   Cardiovascular: Normal rate, regular rhythm. Grossly normal heart sounds.  Good peripheral circulation. Respiratory: Normal respiratory effort.  No retractions. expiratory wheezes in all fields moving good air Gastrointestinal: felt distended some tenderness left lower quadrant greater than right lower quadrant negative Rovsing's no McBurney's tenderness no frank peritonitis rectal exam: Sentinel pile noted but no fissure or obvious quite a bit of discomfort with DRE guaiac positive hematochezia brown stool Musculoskeletal: No lower extremity edema   Neurologic:  Normal speech and language. No gross focal neurologic deficits are appreciated. Skin:  Skin is warm, dry and intact. No rash noted. Psychiatric: Mood and affect are normal. Speech and behavior are normal.    ____________________________________________   DIFFERENTIAL includes but not limited to  diverticulitis, appendicitis, anal fissure, perirectal abscess, asthma, bronchitis, pneumonia ____________________________________________   LABS (all labs ordered are listed, but only abnormal results are displayed)  Labs Reviewed  COMPREHENSIVE METABOLIC PANEL - Abnormal; Notable for the following:       Result Value   Glucose, Bld 102 (*)    Creatinine, Ser 1.38 (*)    Calcium 8.7 (*)    Total Bilirubin 0.2 (*)    All other components within normal limits  CBC - Abnormal; Notable for the following:      WBC 15.2 (*)    All other components within normal limits  URINALYSIS, COMPLETE (UACMP) WITH MICROSCOPIC - Abnormal; Notable for the following:    Color, Urine YELLOW (*)    APPearance HAZY (*)    Specific Gravity, Urine 1.032 (*)    Ketones, ur 5 (*)    Protein, ur 30 (*)    Bacteria, UA RARE (*)    Squamous Epithelial / LPF 0-5 (*)    All other components within normal limits  LIPASE, BLOOD    slight white count is nonspecific __________________________________________  EKG   ____________________________________________  RADIOLOGY  chest x-ray with no acute disease CT scan shows proctocolitis with no obvious abscess ____________________________________________   PROCEDURES  Procedure(s) performed: no  Procedures  Critical Care performed: no  Observation: no ____________________________________________   INITIAL IMPRESSION / ASSESSMENT AND PLAN / ED COURSE  Pertinent labs & imaging results that were available during my care of the patient were  reviewed by me and considered in my medical decision making (see chart for details).  Regarding the patient's cough and shortness of breath he does have obvious wheezing. I will treat him symptomatically as an asthma exacerbation. His chest x-ray is negative for evidence of pneumonia.  Regarding his abdominal pain he does have a white count and lower abdominal tenderness and concerned he may have diverticulitis. CT scan obtained which shows proctocolitis with no obvious abscess. He may be developing a perianal versus perirectal abscess I will treat him with Augmentin and Flagyl and refer him to general surgery. Strict return precautions given.      ____________________________________________   FINAL CLINICAL IMPRESSION(S) / ED DIAGNOSES  Final diagnoses:  Mild asthma with exacerbation, unspecified whether persistent  Proctocolitis      NEW MEDICATIONS STARTED DURING THIS VISIT:  Discharge Medication List  as of 06/06/2017  1:46 AM    START taking these medications   Details  albuterol (PROVENTIL) (2.5 MG/3ML) 0.083% nebulizer solution Take 3 mLs (2.5 mg total) by nebulization every 6 (six) hours as needed for wheezing or shortness of breath., Starting Fri 06/06/2017, Print    amoxicillin-clavulanate (AUGMENTIN) 875-125 MG tablet Take 1 tablet by mouth 2 (two) times daily., Starting Fri 06/06/2017, Until Fri 06/20/2017, Print    metroNIDAZOLE (FLAGYL) 500 MG tablet Take 1 tablet (500 mg total) by mouth 3 (three) times daily., Starting Fri 06/06/2017, Until Fri 06/20/2017, Print    Spacer/Aero Chamber Mouthpiece MISC 1 Units by Does not apply route every 4 (four) hours as needed (wheezing)., Starting Fri 06/06/2017, Print         Note:  This document was prepared using Dragon voice recognition software and may include unintentional dictation errors.     Merrily Brittle, MD 06/06/17 [phone]

## 2017-06-06 NOTE — ED Notes (Addendum)
Pt sttes that when he went to [facility] to provide urine sample for UA, he experienced some rectal bleeding. Pt reports there did not appear to be any stool, just "bloody mucus". Pt reports h/x of anal fissure and perirectal abscess; Dr Lamont Snowballifenbark to be made aware.

## 2017-06-09 DIAGNOSIS — J45909 Unspecified asthma, uncomplicated: Secondary | ICD-10-CM

## 2017-06-09 DIAGNOSIS — K859 Acute pancreatitis without necrosis or infection, unspecified: Secondary | ICD-10-CM | POA: Insufficient documentation

## 2017-06-12 ENCOUNTER — Ambulatory Visit (INDEPENDENT_AMBULATORY_CARE_PROVIDER_SITE_OTHER): Payer: Self-pay | Admitting: General Surgery

## 2017-06-12 ENCOUNTER — Encounter: Payer: Self-pay | Admitting: General Surgery

## 2017-06-12 DIAGNOSIS — G8929 Other chronic pain: Secondary | ICD-10-CM

## 2017-06-12 DIAGNOSIS — M545 Low back pain: Secondary | ICD-10-CM

## 2017-06-12 DIAGNOSIS — I499 Cardiac arrhythmia, unspecified: Secondary | ICD-10-CM | POA: Insufficient documentation

## 2017-06-12 DIAGNOSIS — M549 Dorsalgia, unspecified: Secondary | ICD-10-CM

## 2017-06-12 DIAGNOSIS — K529 Noninfective gastroenteritis and colitis, unspecified: Secondary | ICD-10-CM | POA: Insufficient documentation

## 2017-06-12 MED ORDER — HYDROCORTISONE ACETATE 25 MG RE SUPP: 25.0000 mg | Freq: Two times a day (BID) | RECTAL | 0 refills | Status: DC

## 2017-06-12 NOTE — Progress Notes (Signed)
Patient ID: Randall CompJames D Stringfellow, male   DOB: 1981-02-23, 36 y.o.   MRN: [id number]  CC: Abdominal pain  HPI Randall Shelton is a 36 y.o. male presents to clinic for evaluation of abdominal pain. Patient reports she was seen in the ER a week ago for evaluation of right lower quadrant abdominal pain and passing bloody mucus per rectum. He was diagnosed with proctocolitis at that time and started on antibiotics. He reports she's had copious amounts of diarrhea since that time and that his right lower quadrant abdominal pain has improved but not gone away completely. The pain as a dull ache currently but was a stabbing sensation when he went to the ER. He denies any current fevers, chills, nausea, vomiting, chest pain, shortness of breath. 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 He denies any pain with bowel movements or rectal pain at all.  HPI  Past Medical History:  Diagnosis Date  . Asthma   . Chronic back pain   . Irregular heart beat   . Pancreatitis   . Proctocolitis     Past Surgical History:  Procedure Laterality Date  . WISDOM TOOTH EXTRACTION Bilateral 2002    Family History  Problem Relation Age of Onset  . COPD Mother        Smoker  . Hiatal hernia Mother   . Heart disease Father   . Hypertension Father   . Diverticulitis Father   . Bone cancer Father   . Heart disease Sister   . Seizures Sister   . Healthy Sister     Social History Social History  Substance Use Topics  . Smoking status: Current Every Day Smoker    Packs/day: 1.50    Types: Cigarettes  . Smokeless tobacco: Never Used  . Alcohol use Yes     Comment: Occasional    No Known Allergies  Current Outpatient Prescriptions  Medication Sig Dispense Refill  . albuterol (PROVENTIL HFA;VENTOLIN HFA) 108 (90 Base) MCG/ACT inhaler Inhale 2 puffs into the lungs every 6 (six) hours as needed for wheezing or shortness of breath. 1 Inhaler 0  . albuterol (PROVENTIL) (2.5  MG/3ML) 0.083% nebulizer solution Take 3 mLs (2.5 mg total) by nebulization every 6 (six) hours as needed for wheezing or shortness of breath. 75 mL 12  . amoxicillin-clavulanate (AUGMENTIN) 875-125 MG tablet Take 1 tablet by mouth 2 (two) times daily. 28 tablet 0  . metroNIDAZOLE (FLAGYL) 500 MG tablet Take 1 tablet (500 mg total) by mouth 3 (three) times daily. 42 tablet 0  . Spacer/Aero Chamber Mouthpiece MISC 1 Units by Does not apply route every 4 (four) hours as needed (wheezing). 1 each 0  . hydrocortisone (ANUSOL-HC) 25 MG suppository Place 1 suppository (25 mg total) rectally 2 (two) times daily. 24 suppository 0   No current facility-administered medications for this visit.      Review of Systems A Multi-point review of systems was asked and was negative except for the findings documented in the history of present illness  Physical Exam Blood pressure 120/75, pulse 93, temperature 98.4 F (36.9 C), temperature source Oral, height 6\' 1"  (1.854 m), weight 89.3 kg (196 lb 12.8 oz). CONSTITUTIONAL: No acute distress. EYES: Pupils are equal, round, and reactive to light, Sclera are non-icteric. EARS, NOSE, MOUTH AND THROAT: The oropharynx is clear. The oral mucosa is pink and moist. Hearing is intact to voice. LYMPH NODES:  Lymph nodes in the neck are normal.  RESPIRATORY:  Lungs are clear. There is normal respiratory effort, with equal breath sounds bilaterally, and without pathologic use of accessory muscles. CARDIOVASCULAR: Heart is regular without murmurs, gallops, or rubs. GI: The abdomen is soft, minimally tender to deep palpation along the entire right side of the abdomen without rebound or guarding, and nondistended. There are no palpable masses. There is no hepatosplenomegaly. There are normal bowel sounds in all quadrants. GU: Rectal exam shows circumferential inflamed internal hemorrhoids. No protrusion. There is a sentinel pile but no tenderness at the site of the previous  fissure. Normal rectal tone..   MUSCULOSKELETAL: Normal muscle strength and tone. No cyanosis or edema.   SKIN: Turgor is good and there are no pathologic skin lesions or ulcers. NEUROLOGIC: Motor and sensation is grossly normal. Cranial nerves are grossly intact. PSYCH:  Oriented to person, place and time. Affect is normal.  Data Reviewed Images and labs reviewed which showed a leukocytosis of 15.2 but otherwise within normal limits. CT scan of the abdomen shows a colon full of stool as well as inflammation around the mid to distal rectum. No free air, free fluid, abscess. I have personally reviewed the patient's imaging, laboratory findings and medical records.    Assessment    Abdominal pain    Plan    36 year old male with right lower quadrant abdominal pain that is likely related to stool burden that is increased secondary to inflamed hemorrhoids. Given this presentation discussed of the antibiotics that he is on would help but a steroid suppository and sits baths would be of benefit for his hemorrhoid burden. Discussed GI referral for continued workup. He voiced understanding and agrees with this plan. He'll follow-up in clinic on an as-needed basis.     Time spent with the patient was 30 minutes, with more than 50% of the time spent in face-to-face education, counseling and care coordination.     Ricarda Frame, MD FACS General Surgeon 06/12/2017, 9:52 AM

## 2017-06-12 NOTE — Patient Instructions (Signed)
We have seen you today for Inflamed Internal Hemorrhoids. We will start you on a suppository to help with this inflammation. If your copay is too expensive for this medication, please obtain Hydrocortisone Suppositories over the counter.  You may do Sitz baths 2-3 times daily as needed for discomfort.  If you develop any increased pain, bleeding, redness, drainage of pus, or any fever or chills; please call our office immediately so that we can work you in.  We will have you follow-up with Gastroenterology as well.  We have placed this referral, their office will call you with any appointment. Please call our office with any questions or concerns.   Hemorrhoids Hemorrhoids are swollen veins in and around the rectum or anus. There are two types of hemorrhoids:  Internal hemorrhoids. These occur in the veins that are just inside the rectum. They may poke through to the outside and become irritated and painful.  External hemorrhoids. These occur in the veins that are outside of the anus and can be felt as a painful swelling or hard lump near the anus.  Most hemorrhoids do not cause serious problems, and they can be managed with home treatments such as diet and lifestyle changes. If home treatments do not help your symptoms, procedures can be done to shrink or remove the hemorrhoids. What are the causes? This condition is caused by increased pressure in the anal area. This pressure may result from various things, including:  Constipation.  Straining to have a bowel movement.  Diarrhea.  Pregnancy.  Obesity.  Sitting for long periods of time.  Heavy lifting or other activity that causes you to strain.  Anal sex.  What are the signs or symptoms? Symptoms of this condition include:  Pain.  Anal itching or irritation.  Rectal bleeding.  Leakage of stool (feces).  Anal swelling.  One or more lumps around the anus.  How is this diagnosed? This condition can often be  diagnosed through a visual exam. Other exams or tests may also be done, such as:  Examination of the rectal area with a gloved hand (digital rectal exam).  Examination of the anal canal using a small tube (anoscope).  A blood test, if you have lost a significant amount of blood.  A test to look inside the colon (sigmoidoscopy or colonoscopy).  How is this treated? This condition can usually be treated at home. However, various procedures may be done if dietary changes, lifestyle changes, and other home treatments do not help your symptoms. These procedures can help make the hemorrhoids smaller or remove them completely. Some of these procedures involve surgery, and others do not. Common procedures include:  Rubber band ligation. Rubber bands are placed at the base of the hemorrhoids to cut off the blood supply to them.  Sclerotherapy. Medicine is injected into the hemorrhoids to shrink them.  Infrared coagulation. A type of light energy is used to get rid of the hemorrhoids.  Hemorrhoidectomy surgery. The hemorrhoids are surgically removed, and the veins that supply them are tied off.  Stapled hemorrhoidopexy surgery. A circular stapling device is used to remove the hemorrhoids and use staples to cut off the blood supply to them.  Follow these instructions at home: Eating and drinking  Eat foods that have a lot of fiber in them, such as whole grains, beans, nuts, fruits, and vegetables. Ask your health care provider about taking products that have added fiber (fiber supplements).  Drink enough fluid to keep your urine clear or pale  yellow. Managing pain and swelling  Take warm sitz baths for 20 minutes, 3-4 times a day to ease pain and discomfort.  If directed, apply ice to the affected area. Using ice packs between sitz baths may be helpful. ? Put ice in a plastic bag. ? Place a towel between your skin and the bag. ? Leave the ice on for 20 minutes, 2-3 times a day. General  instructions  Take over-the-counter and prescription medicines only as told by your health care provider.  Use medicated creams or suppositories as told.  Exercise regularly.  Go to the bathroom when you have the urge to have a bowel movement. Do not wait.  Avoid straining to have bowel movements.  Keep the anal area dry and clean. Use wet toilet paper or moist towelettes after a bowel movement.  Do not sit on the toilet for long periods of time. This increases blood pooling and pain. Contact a health care provider if:  You have increasing pain and swelling that are not controlled by treatment or medicine.  You have uncontrolled bleeding.  You have difficulty having a bowel movement, or you are unable to have a bowel movement.  You have pain or inflammation outside the area of the hemorrhoids. This information is not intended to replace advice given to you by your health care provider. Make sure you discuss any questions you have with your health care provider. Document Released: 09/13/2000 Document Revised: 02/14/2016 Document Reviewed: 05/31/2015 Elsevier Interactive Patient Education  2017 [employer]   Hydrocortisone suppositories What is this medicine? HYDROCORTISONE (hye droe KOR ti sone) is a corticosteroid. It is used to decrease swelling, itching, and pain that is caused by minor skin irritations or by hemorrhoids. This medicine may be used for other purposes; ask your health care provider or pharmacist if you have questions. COMMON BRAND NAME(S): Anucort-HC, Anumed-HC, Anusol HC, Encort, GRx HiCort, Hemmorex-HC, Hemorrhoidal-HC, Hemril, Proctocort, Proctosert HC, Proctosol-HC, Rectacort HC, Rectasol-HC What should I tell my health care provider before I take this medicine? They need to know if you have any of these conditions: -an unusual or allergic reaction to hydrocortisone, corticosteroids, other medicines, foods, dyes, or preservatives -pregnant or trying to get  pregnant -breast-feeding How should I use this medicine? This medicine is for rectal use only. Do not take by mouth. Wash your hands before and after use. Take off the foil wrapping. Wet the tip of the suppository with cold tap water to make it easier to use. Lie on your side with your lower leg straightened out and your upper leg bent forward toward your stomach. Lift upper buttock to expose the rectal area. Apply gentle pressure to insert the suppository completely into the rectum, pointed end first. Hold buttocks together for a few seconds. Remain lying down for about 15 minutes to avoid having the suppository come out. Do not use more often than directed. Talk to your pediatrician regarding the use of this medicine in children. Special care may be needed. Overdosage: If you think you have taken too much of this medicine contact a poison control center or emergency room at once. NOTE: This medicine is only for you. Do not share this medicine with others. What if I miss a dose? If you miss a dose, use it as soon as you can. If it is almost time for your next dose, use only that dose. Do not use double or extra doses. What may interact with this medicine? Interactions are not expected. Do not use  any other rectal products on the affected area without telling your doctor or health care professional. This list may not describe all possible interactions. Give your health care provider a list of all the medicines, herbs, non-prescription drugs, or dietary supplements you use. Also tell them if you smoke, drink alcohol, or use illegal drugs. Some items may interact with your medicine. What should I watch for while using this medicine? Visit your doctor or health care professional for regular checks on your progress. Tell your doctor or health care professional if your symptoms do not improve after a few days of use. Do not use if there is blood in your stools. If you get any type of infection while using  this medicine, you may need to stop using this medicine until our infections clears up. Ask your doctor or health care professional for advice. What side effects may I notice from receiving this medicine? Side effects that you should report to your doctor or health care professional as soon as possible: -bloody or black, tarry stools -painful, red, pus filled blisters in hair follicles -rectal pain, burning or bleeding after use of medicine Side effects that usually do not require medical attention (report to your doctor or health care professional if they continue or are bothersome): -changes in skin color -dry skin -itching or irritation This list may not describe all possible side effects. Call your doctor for medical advice about side effects. You may report side effects to FDA at 1-800-FDA-1088. Where should I keep my medicine? Keep out of the reach of children. Store at room temperature between 20 and 25 degrees C (68 and 77 degrees F). Protect from heat and freezing. Throw away any unused medicine after the expiration date. NOTE: This sheet is a summary. It may not cover all possible information. If you have questions about this medicine, talk to your doctor, pharmacist, or health care provider.  2018 Elsevier/Gold Standard (2008-01-29 16:07:24)   How to Take a Sitz Bath A sitz bath is a warm water bath that is taken while you are sitting down. The water should only come up to your hips and should cover your buttocks. Your health care provider may recommend a sitz bath to help you:  Clean the lower part of your body, including your genital area.  With itching.  With pain.  With sore muscles or muscles that tighten or spasm.  How to take a sitz bath Take 3-4 sitz baths per day or as told by your health care provider. 1. Partially fill a bathtub with warm water. You will only need the water to be deep enough to cover your hips and buttocks when you are sitting in it. 2. If your  health care provider told you to put medicine in the water, follow the directions exactly. 3. Sit in the water and open the tub drain a little. 4. Turn on the warm water again to keep the tub at the correct level. Keep the water running constantly. 5. Soak in the water for 15-20 minutes or as told by your health care provider. 6. After the sitz bath, pat the affected area dry first. Do not rub it. 7. Be careful when you stand up after the sitz bath because you may feel dizzy.  Contact a health care provider if:  Your symptoms get worse. Do not continue with sitz baths if your symptoms get worse.  You have new symptoms. Do not continue with sitz baths until you talk with your health care  provider. This information is not intended to replace advice given to you by your health care provider. Make sure you discuss any questions you have with your health care provider. Document Released: 06/08/2004 Document Revised: 02/14/2016 Document Reviewed: 09/14/2014 Elsevier Interactive Patient Education  [employer].

## 2017-10-21 ENCOUNTER — Encounter: Payer: Self-pay | Admitting: Emergency Medicine

## 2017-10-21 ENCOUNTER — Other Ambulatory Visit: Payer: Self-pay

## 2017-10-21 ENCOUNTER — Emergency Department
Admission: EM | Admit: 2017-10-21 | Discharge: 2017-10-21 | Disposition: A | Discharge status: Home / Self Care | Payer: Self-pay | Attending: Emergency Medicine | Admitting: Emergency Medicine

## 2017-10-21 DIAGNOSIS — Z5321 Procedure and treatment not carried out due to patient leaving prior to being seen by health care provider: Secondary | ICD-10-CM | POA: Insufficient documentation

## 2017-10-21 DIAGNOSIS — R509 Fever, unspecified: Secondary | ICD-10-CM | POA: Insufficient documentation

## 2017-10-21 DIAGNOSIS — R05 Cough: Secondary | ICD-10-CM | POA: Insufficient documentation

## 2017-10-21 NOTE — ED Notes (Signed)
States he is leaving   States he is not able to breath and "should have had a breathing treatment " in triage

## 2017-10-21 NOTE — ED Triage Notes (Signed)
Pt to ed with c/o cough and congestion and fever x 2 days.

## 2018-03-03 ENCOUNTER — Emergency Department
Admission: EM | Admit: 2018-03-03 | Discharge: 2018-03-03 | Disposition: A | Discharge status: Home / Self Care | Payer: Self-pay | Attending: Emergency Medicine | Admitting: Emergency Medicine

## 2018-03-03 ENCOUNTER — Emergency Department: Payer: Self-pay

## 2018-03-03 DIAGNOSIS — M779 Enthesopathy, unspecified: Secondary | ICD-10-CM | POA: Insufficient documentation

## 2018-03-03 DIAGNOSIS — F17210 Nicotine dependence, cigarettes, uncomplicated: Secondary | ICD-10-CM | POA: Insufficient documentation

## 2018-03-03 DIAGNOSIS — M778 Other enthesopathies, not elsewhere classified: Secondary | ICD-10-CM

## 2018-03-03 DIAGNOSIS — J45909 Unspecified asthma, uncomplicated: Secondary | ICD-10-CM | POA: Insufficient documentation

## 2018-03-03 DIAGNOSIS — Z79899 Other long term (current) drug therapy: Secondary | ICD-10-CM | POA: Insufficient documentation

## 2018-03-03 MED ORDER — MELOXICAM 15 MG PO TABS: 15.0000 mg | ORAL_TABLET | Freq: Every day | ORAL | 0 refills | Status: DC

## 2018-03-03 NOTE — ED Provider Notes (Signed)
[facility] Emergency Department Provider Note  ____________________________________________  Time seen: Approximately 8:36 PM  I have reviewed the triage vital signs and the nursing notes.   HISTORY  Chief Complaint Hand Pain    HPI Randall Shelton is a 37 y.o. male who presents the emergency department complaining of pain to the third digit, third MCP joint, hand over the third metacarpal region.  Patient denies any injury or trauma to the area.  He reports intermittent mild edema but no erythema.  No history of same in the past.  Patient reports that he works with his hands daily.  No specific repetitive motion but does use hand tools frequently.  Patient has had some numbness and tingling that is intermittent in nature.  No other injury or complaint.  No medication for this complaint prior to arrival.  This is been ongoing x3 days.  Past Medical History:  Diagnosis Date  . Asthma   . Chronic back pain   . Irregular heart beat   . Pancreatitis   . Proctocolitis     Patient Active Problem List   Diagnosis Date Noted  . Chronic back pain   . Irregular heart beat   . Proctocolitis   . Pancreatitis   . Asthma     Past Surgical History:  Procedure Laterality Date  . WISDOM TOOTH EXTRACTION Bilateral 2002    Prior to Admission medications   Medication Sig Start Date End Date Taking? Authorizing Provider  albuterol (PROVENTIL HFA;VENTOLIN HFA) 108 (90 Base) MCG/ACT inhaler Inhale 2 puffs into the lungs every 6 (six) hours as needed for wheezing or shortness of breath. 06/06/17   Merrily Brittleifenbark, Neil, MD  albuterol (PROVENTIL) (2.5 MG/3ML) 0.083% nebulizer solution Take 3 mLs (2.5 mg total) by nebulization every 6 (six) hours as needed for wheezing or shortness of breath. 06/06/17   Merrily Brittleifenbark, Neil, MD  hydrocortisone (ANUSOL-HC) 25 MG suppository Place 1 suppository (25 mg total) rectally 2 (two) times daily. 06/12/17   Ricarda FrameWoodham, Charles, MD  meloxicam (MOBIC) 15 MG  tablet Take 1 tablet (15 mg total) by mouth daily. 03/03/18   Cuthriell, Delorise RoyalsJonathan D, PA-C  Spacer/Aero Chamber Mouthpiece MISC 1 Units by Does not apply route every 4 (four) hours as needed (wheezing). 06/06/17   Merrily Brittleifenbark, Neil, MD    Allergies Patient has no known allergies.  Family History  Problem Relation Age of Onset  . COPD Mother        Smoker  . Hiatal hernia Mother   . Heart disease Father   . Hypertension Father   . Diverticulitis Father   . Bone cancer Father   . Heart disease Sister   . Seizures Sister   . Healthy Sister     Social History Social History   Tobacco Use  . Smoking status: Current Every Day Smoker    Packs/day: 1.50    Types: Cigarettes  . Smokeless tobacco: Never Used  Substance Use Topics  . Alcohol use: Yes    Comment: Occasional  . Drug use: Yes    Types: Marijuana    Comment: Last Use- 06/11/17 (Per Patient- 06/12/17)     Review of Systems  Constitutional: No fever/chills Eyes: No visual changes.  Cardiovascular: no chest pain. Respiratory: no cough. No SOB. Gastrointestinal: No abdominal pain.  No nausea, no vomiting.   Musculoskeletal: Positive for pain to the third digit, third MCP joint, third metacarpal region. Skin: Negative for rash, abrasions, lacerations, ecchymosis. Neurological: Negative for headaches, focal weakness or  numbness. 10-point ROS otherwise negative.  ____________________________________________   PHYSICAL EXAM:  VITAL SIGNS: ED Triage Vitals [03/03/18 1905]  Enc Vitals Group     BP 118/67     Pulse Rate (!) 58     Resp 18     Temp 98.5 F (36.9 C)     Temp Source Oral     SpO2 96 %     Weight 196 lb (88.9 kg)     Height 6' (1.829 m)     Head Circumference      Peak Flow      Pain Score 4     Pain Loc      Pain Edu?      Excl. in GC?      Constitutional: Alert and oriented. Well appearing and in no acute distress. Eyes: Conjunctivae are normal. PERRL. EOMI. Head: Atraumatic. Neck: No  stridor.    Cardiovascular: Normal rate, regular rhythm. Normal S1 and S2.  Good peripheral circulation. Respiratory: Normal respiratory effort without tachypnea or retractions. Lungs CTAB. Good air entry to the bases with no decreased or absent breath sounds. Musculoskeletal: Full range of motion to all extremities. No gross deformities appreciated.  No gross erythema, edema, deformity noted.  Very minimal edema surrounding the MCP joint when compared with unaffected extremity.  Patient has full range of motion to the right wrist, all digits right hand.  No significant tenderness to palpation over the osseous structures of the third digit, third metacarpal region.  Percussion over the flexor and extensor tendon reproduces pain.  Negative Tinel's.  Positive Phalen's.  Sensation intact all 5 digits.  Capillary refill intact all 5 digits. Neurologic:  Normal speech and language. No gross focal neurologic deficits are appreciated.  Skin:  Skin is warm, dry and intact. No rash noted. Psychiatric: Mood and affect are normal. Speech and behavior are normal. Patient exhibits appropriate insight and judgement.   ____________________________________________   LABS (all labs ordered are listed, but only abnormal results are displayed)  Labs Reviewed - No data to display ____________________________________________  EKG   ____________________________________________  RADIOLOGY Festus Barren Cuthriell, personally viewed and evaluated these images (plain radiographs) as part of my medical decision making, as well as reviewing the written report by the radiologist.  Dg Hand Complete Right  Result Date: 03/03/2018 CLINICAL DATA:  Right hand pain without known injury. EXAM: RIGHT HAND - COMPLETE 3+ VIEW COMPARISON:  None. FINDINGS: There is no evidence of fracture or dislocation. There is no evidence of arthropathy or other focal bone abnormality. Soft tissues are unremarkable. IMPRESSION: Negative.  Electronically Signed   By: Ted Mcalpine M.D.   On: 03/03/2018 19:38    ____________________________________________    PROCEDURES  Procedure(s) performed:    .Splint Application Date/Time: 03/03/2018 8:50 PM Performed by: Racheal Patches, PA-C Authorized by: Racheal Patches, PA-C   Consent:    Consent obtained:  Verbal   Consent given by:  Patient   Risks discussed:  Pain Pre-procedure details:    Sensation:  Normal Procedure details:    Laterality:  Right   Location:  Wrist   Wrist:  R wrist   Splint type:  Wrist   Supplies:  Prefabricated splint Post-procedure details:    Pain:  Improved   Patient tolerance of procedure:  Tolerated well, no immediate complications      Medications - No data to display   ____________________________________________   INITIAL IMPRESSION / ASSESSMENT AND PLAN / ED COURSE  Pertinent labs &  imaging results that were available during my care of the patient were reviewed by me and considered in my medical decision making (see chart for details).  Review of the [place] CSRS was performed in accordance of the [facility] prior to dispensing any controlled drugs.     Patient's diagnosis is consistent with tendinitis of the third digit of the right hand.  Patient presents emergency department with a burning and aching sensation to the third digit, third metacarpal region.  Exam was reassuring with no acute findings.  X-ray reveals no acute osseous abnormality.  Differential included tendinitis, infectious tenosynovitis, carpal tunnel syndrome, fracture, sprain, trigger finger.  Patient's fingers are buddy taped, patient is given wrist brace for movement reduction.  Patient will be placed on meloxicam for symptom control.  Patient will follow-up with orthopedic/hand surgery as needed.  Patient is given ED precautions to return to the ED for any worsening or new symptoms.     ____________________________________________  FINAL  CLINICAL IMPRESSION(S) / ED DIAGNOSES  Final diagnoses:  Tendinitis of finger of right hand      NEW MEDICATIONS STARTED DURING THIS VISIT:  ED Discharge Orders        Ordered    meloxicam (MOBIC) 15 MG tablet  Daily     03/03/18 2046          This chart was dictated using voice recognition software/Dragon. Despite best efforts to proofread, errors can occur which can change the meaning. Any change was purely unintentional.    Racheal Patches, PA-C 03/03/18 2051    Sharyn Creamer, MD 03/04/18 [phone]

## 2018-03-03 NOTE — ED Triage Notes (Signed)
Patient c/o right hand pain beginning on awakening beginning Sunday. Patient denies injury.

## 2018-10-03 ENCOUNTER — Encounter: Payer: Self-pay | Admitting: Emergency Medicine

## 2018-10-03 ENCOUNTER — Other Ambulatory Visit: Payer: Self-pay

## 2018-10-03 ENCOUNTER — Emergency Department: Payer: Self-pay

## 2018-10-03 DIAGNOSIS — J45909 Unspecified asthma, uncomplicated: Secondary | ICD-10-CM | POA: Insufficient documentation

## 2018-10-03 DIAGNOSIS — F17210 Nicotine dependence, cigarettes, uncomplicated: Secondary | ICD-10-CM | POA: Insufficient documentation

## 2018-10-03 DIAGNOSIS — Z79899 Other long term (current) drug therapy: Secondary | ICD-10-CM | POA: Insufficient documentation

## 2018-10-03 DIAGNOSIS — J209 Acute bronchitis, unspecified: Secondary | ICD-10-CM | POA: Insufficient documentation

## 2018-10-03 NOTE — ED Triage Notes (Signed)
Pt arrives ambulatory to triage with c/o cough x 1 week. Pt states that his chest hurts when he coughs. Pt has clear lung sounds at this time and in NAD.

## 2018-10-04 ENCOUNTER — Emergency Department
Admission: EM | Admit: 2018-10-04 | Discharge: 2018-10-04 | Disposition: A | Discharge status: Home / Self Care | Payer: Self-pay | Attending: Emergency Medicine | Admitting: Emergency Medicine

## 2018-10-04 DIAGNOSIS — J209 Acute bronchitis, unspecified: Secondary | ICD-10-CM

## 2018-10-04 MED ORDER — ALBUTEROL SULFATE (2.5 MG/3ML) 0.083% IN NEBU: 2.5000 mg | INHALATION_SOLUTION | RESPIRATORY_TRACT | 0 refills | Status: DC | PRN

## 2018-10-04 MED ORDER — KETOROLAC TROMETHAMINE 30 MG/ML IJ SOLN
30.0000 mg | Freq: Once | INTRAMUSCULAR | Status: AC
  Administered 2018-10-04: 30 mg via INTRAMUSCULAR
  Filled 2018-10-04: qty 1

## 2018-10-04 MED ORDER — PREDNISONE 20 MG PO TABS
60.0000 mg | ORAL_TABLET | Freq: Once | ORAL | Status: AC
  Administered 2018-10-04: 60 mg via ORAL
  Filled 2018-10-04: qty 3

## 2018-10-04 MED ORDER — PREDNISONE 20 MG PO TABS: ORAL_TABLET | ORAL | 0 refills | Status: DC

## 2018-10-04 MED ORDER — IPRATROPIUM-ALBUTEROL 0.5-2.5 (3) MG/3ML IN SOLN
3.0000 mL | Freq: Once | RESPIRATORY_TRACT | Status: AC
  Administered 2018-10-04: 3 mL via RESPIRATORY_TRACT
  Filled 2018-10-04: qty 3

## 2018-10-04 MED ORDER — HYDROCOD POLST-CPM POLST ER 10-8 MG/5ML PO SUER: 5.0000 mL | Freq: Two times a day (BID) | ORAL | 0 refills | Status: DC

## 2018-10-04 MED ORDER — HYDROCOD POLST-CPM POLST ER 10-8 MG/5ML PO SUER
5.0000 mL | Freq: Once | ORAL | Status: AC
  Administered 2018-10-04: 5 mL via ORAL
  Filled 2018-10-04: qty 5

## 2018-10-04 NOTE — ED Notes (Signed)
Pt reports cough for 1 weeks; says he's taken Mucinex with no relief; has not seen provider before tonight; reports getting winded with ambulation at times; talking in complete coherent sentences; 1.5 ppd smoker;

## 2018-10-04 NOTE — Discharge Instructions (Addendum)
1.  Take Prednisone 60 mg daily x4 days.  Start your next dose Monday morning. 2.  You may take Tussionex as needed for cough. 3.  Continue Albuterol inhaler every 4 hours as needed for cough/wheezing/difficulty breathing. 4.  Return to the ER for worsening symptoms, persistent vomiting, difficulty breathing or other concerns.

## 2018-10-04 NOTE — ED Provider Notes (Signed)
[facility] Emergency Department Provider Note   ____________________________________________   First MD Initiated Contact with Patient 10/04/18 [phone]     (approximate)  I have reviewed the triage vital signs and the nursing notes.   HISTORY  Chief Complaint Cough    HPI Randall Shelton is a 38 y.o. male who presents to the ED from home with a chief complaint of cough and congestion.  Patient reports a one-week history of "head cold" which has now descended into his lungs.  Complains of cough productive of yellow sputum.  States his chest hurts when he coughs.  Reports subjective fevers.  Denies associated chills, abdominal pain, nausea, vomiting or diarrhea.  Denies recent travel or trauma.   Past Medical History:  Diagnosis Date  . Asthma   . Chronic back pain   . Irregular heart beat   . Pancreatitis   . Proctocolitis   Never hospitalized for asthma since he was a child  Patient Active Problem List   Diagnosis Date Noted  . Chronic back pain   . Irregular heart beat   . Proctocolitis   . Pancreatitis   . Asthma     Past Surgical History:  Procedure Laterality Date  . WISDOM TOOTH EXTRACTION Bilateral 2002    Prior to Admission medications   Medication Sig Start Date End Date Taking? Authorizing Provider  albuterol (PROVENTIL HFA;VENTOLIN HFA) 108 (90 Base) MCG/ACT inhaler Inhale 2 puffs into the lungs every 6 (six) hours as needed for wheezing or shortness of breath. 06/06/17  Yes Merrily Brittle, MD  chlorpheniramine-HYDROcodone (TUSSIONEX PENNKINETIC ER) 10-8 MG/5ML SUER Take 5 mLs by mouth 2 (two) times daily. 10/04/18   Irean Hong, MD  predniSONE (DELTASONE) 20 MG tablet 3 tablets PO qd x 4 days 10/04/18   Irean Hong, MD  Spacer/Aero Chamber Mouthpiece MISC 1 Units by Does not apply route every 4 (four) hours as needed (wheezing). 06/06/17   Merrily Brittle, MD    Allergies Patient has no known allergies.  Family History  Problem  Relation Age of Onset  . COPD Mother        Smoker  . Hiatal hernia Mother   . Heart disease Father   . Hypertension Father   . Diverticulitis Father   . Bone cancer Father   . Heart disease Sister   . Seizures Sister   . Healthy Sister     Social History Social History   Tobacco Use  . Smoking status: Current Every Day Smoker    Packs/day: 1.50    Types: Cigarettes  . Smokeless tobacco: Never Used  Substance Use Topics  . Alcohol use: Yes    Comment: Occasional  . Drug use: Yes    Types: Marijuana    Comment: Last Use- 06/11/17 (Per Patient- 06/12/17)    Review of Systems  Constitutional: No fever/chills Eyes: No visual changes. ENT: No sore throat. Cardiovascular: Positive for chest pain only on coughing. Respiratory: Positive for productive cough and shortness of breath. Gastrointestinal: No abdominal pain.  No nausea, no vomiting.  No diarrhea.  No constipation. Genitourinary: Negative for dysuria. Musculoskeletal: Negative for back pain. Skin: Negative for rash. Neurological: Negative for headaches, focal weakness or numbness.   ____________________________________________   PHYSICAL EXAM:  VITAL SIGNS: ED Triage Vitals  Enc Vitals Group     BP 10/03/18 2308 108/62     Pulse Rate 10/03/18 2308 75     Resp 10/03/18 2308 18  Temp 10/03/18 2308 97.7 F (36.5 C)     Temp Source 10/03/18 2308 Oral     SpO2 10/03/18 2308 96 %     Weight 10/03/18 2306 195 lb (88.5 kg)     Height 10/03/18 2306 6\' 1"  (1.854 m)     Head Circumference --      Peak Flow --      Pain Score 10/03/18 2306 2     Pain Loc --      Pain Edu? --      Excl. in GC? --     Constitutional: Alert and oriented. Well appearing and in mild acute distress. Eyes: Conjunctivae are normal. PERRL. EOMI. Head: Atraumatic. Nose: No congestion/rhinnorhea. Mouth/Throat: Mucous membranes are moist.  Oropharynx non-erythematous. Neck: No stridor.  Supple neck without  meningismus. Cardiovascular: Normal rate, regular rhythm. Grossly normal heart sounds.  Good peripheral circulation. Respiratory: Normal respiratory effort.  No retractions. Lungs with scattered rhonchi and wheezing. Gastrointestinal: Soft and nontender. No distention. No abdominal bruits. No CVA tenderness. Musculoskeletal: No lower extremity tenderness nor edema.  No joint effusions. Neurologic:  Normal speech and language. No gross focal neurologic deficits are appreciated. No gait instability. Skin:  Skin is warm, dry and intact. No rash noted. Psychiatric: Mood and affect are normal. Speech and behavior are normal.  ____________________________________________   LABS (all labs ordered are listed, but only abnormal results are displayed)  Labs Reviewed - No data to display ____________________________________________  EKG  None ____________________________________________  RADIOLOGY  ED MD interpretation: No pneumonia  Official radiology report(s): Dg Chest 1 View  Result Date: 10/03/2018 CLINICAL DATA:  Acute onset of cough and generalized chest pain. EXAM: CHEST  1 VIEW COMPARISON:  Chest radiograph 06/06/2017 FINDINGS: The lungs are well-aerated and clear. There is no evidence of focal opacification, pleural effusion or pneumothorax. The cardiomediastinal silhouette is within normal limits. No acute osseous abnormalities are seen. IMPRESSION: No acute cardiopulmonary process seen. Electronically Signed   By: Roanna Raider M.D.   On: 10/03/2018 23:54    ____________________________________________   PROCEDURES  Procedure(s) performed: None  Procedures  Critical Care performed: No  ____________________________________________   INITIAL IMPRESSION / ASSESSMENT AND PLAN / ED COURSE  As part of my medical decision making, I reviewed the following data within the electronic MEDICAL RECORD NUMBER History obtained from family, Nursing notes reviewed and incorporated, Old  chart reviewed, Radiograph reviewed and Notes from prior ED visits   38 year old male, smoker, who presents with bronchitis.  Will administer 60 mg oral prednisone, DuoNeb, oral Tussionex and 30 mg IM Toradol.  Will reassess.  Clinical Course as of Oct 04 409  Wynelle Link Oct 04, 2018  0410 Aeration and wheezing improved.  Will discharge home on prednisone burst, Tussionex and he will follow-up closely with his PCP.  Strict return precautions given.  Patient and girlfriend verbalized understanding and agree with plan of care.   [JS]    Clinical Course User Index [JS] Irean Hong, MD     ____________________________________________   FINAL CLINICAL IMPRESSION(S) / ED DIAGNOSES  Final diagnoses:  Acute bronchitis, unspecified organism     ED Discharge Orders         Ordered    predniSONE (DELTASONE) 20 MG tablet     10/04/18 0304    chlorpheniramine-HYDROcodone (TUSSIONEX PENNKINETIC ER) 10-8 MG/5ML SUER  2 times daily     10/04/18 0304           Note:  This document was prepared  using Conservation officer, historic buildingsDragon voice recognition software and may include unintentional dictation errors.    Irean HongSung, Elaynah Virginia J, MD 10/04/18 [phone]

## 2018-10-04 NOTE — ED Notes (Signed)
In to discharge pt home; sats on room air noted to be 88-90%: MD notified; will reassess pt; holding discharge at this time

## 2018-10-04 NOTE — ED Notes (Signed)
No peripheral IV placed this visit.   Discharge instructions reviewed with patient. Questions fielded by this RN. Patient verbalizes understanding of instructions. Patient discharged home in stable condition per sung. No acute distress noted at time of discharge.   

## 2018-11-09 ENCOUNTER — Emergency Department
Admission: EM | Admit: 2018-11-09 | Discharge: 2018-11-09 | Disposition: A | Discharge status: Home / Self Care | Payer: Self-pay | Attending: Emergency Medicine | Admitting: Emergency Medicine

## 2018-11-09 ENCOUNTER — Emergency Department: Payer: Self-pay

## 2018-11-09 ENCOUNTER — Other Ambulatory Visit: Payer: Self-pay

## 2018-11-09 ENCOUNTER — Encounter: Payer: Self-pay | Admitting: Intensive Care

## 2018-11-09 DIAGNOSIS — F17210 Nicotine dependence, cigarettes, uncomplicated: Secondary | ICD-10-CM | POA: Insufficient documentation

## 2018-11-09 DIAGNOSIS — R0602 Shortness of breath: Secondary | ICD-10-CM | POA: Insufficient documentation

## 2018-11-09 DIAGNOSIS — F1210 Cannabis abuse, uncomplicated: Secondary | ICD-10-CM | POA: Insufficient documentation

## 2018-11-09 DIAGNOSIS — J45909 Unspecified asthma, uncomplicated: Secondary | ICD-10-CM | POA: Insufficient documentation

## 2018-11-09 DIAGNOSIS — R079 Chest pain, unspecified: Secondary | ICD-10-CM

## 2018-11-09 LAB — BASIC METABOLIC PANEL
Anion gap: 8 (ref 5–15)
BUN: 15 mg/dL (ref 6–20)
CO2: 22 mmol/L (ref 22–32)
Calcium: 8.8 mg/dL — ABNORMAL LOW (ref 8.9–10.3)
Chloride: 107 mmol/L (ref 98–111)
Creatinine, Ser: 1.03 mg/dL (ref 0.61–1.24)
GFR calc Af Amer: >60 mL/min (ref >60)
GFR calc non Af Amer: >60 mL/min (ref >60)
Glucose, Bld: 111 mg/dL — ABNORMAL HIGH (ref 70–99)
Potassium: 3.4 mmol/L — ABNORMAL LOW (ref 3.5–5.1)
Sodium: 137 mmol/L (ref 135–145)

## 2018-11-09 LAB — CBC
HCT: 43 % (ref 39.0–52.0)
Hemoglobin: 14.3 g/dL (ref 13.0–17.0)
MCH: 29.4 pg (ref 26.0–34.0)
MCHC: 33.3 g/dL (ref 30.0–36.0)
MCV: 88.5 fL (ref 80.0–100.0)
NRBC: 0 % (ref 0.0–0.2)
Platelets: 251 10*3/uL (ref 150–400)
RBC: 4.86 MIL/uL (ref 4.22–5.81)
RDW: 13.1 % (ref 11.5–15.5)
WBC: 10.8 10*3/uL — ABNORMAL HIGH (ref 4.0–10.5)

## 2018-11-09 LAB — TROPONIN I: Troponin I: <0.03 ng/mL (ref <0.03)

## 2018-11-09 NOTE — ED Provider Notes (Signed)
[facility] Emergency Department Provider Note       Time seen: ----------------------------------------- 4:49 PM on 11/09/2018 -----------------------------------------   I have reviewed the triage vital signs and the nursing notes.  HISTORY   Chief Complaint Chest Pain    HPI Randall Shelton is a 38 y.o. male with a history of asthma, chronic back pain, pancreatitis who presents to the ED for right-sided chest pain that started this morning with shortness of breath.  Patient states the pain is intermittent but getting worse.  He has never had this happen before.  Pain seems to have subsided.  Past Medical History:  Diagnosis Date  . Asthma   . Chronic back pain   . Irregular heart beat   . Pancreatitis   . Proctocolitis     Patient Active Problem List   Diagnosis Date Noted  . Chronic back pain   . Irregular heart beat   . Proctocolitis   . Pancreatitis   . Asthma     Past Surgical History:  Procedure Laterality Date  . WISDOM TOOTH EXTRACTION Bilateral 2002    Allergies Patient has no known allergies.  Social History Social History   Tobacco Use  . Smoking status: Current Every Day Smoker    Packs/day: 1.50    Types: Cigarettes  . Smokeless tobacco: Never Used  Substance Use Topics  . Alcohol use: Yes    Comment: Occasional  . Drug use: Yes    Types: Marijuana    Comment: Last Use- 06/11/17 (Per Patient- 06/12/17)    Review of Systems Constitutional: Negative for fever. Cardiovascular: Positive for chest pain Respiratory: Positive for shortness of breath Musculoskeletal: Negative for back pain. Skin: Negative for rash. Neurological: Negative for headaches, focal weakness or numbness.  All systems negative/normal/unremarkable except as stated in the HPI  ____________________________________________   PHYSICAL EXAM:  VITAL SIGNS: ED Triage Vitals [11/09/18 1608]  Enc Vitals Group     BP 125/85     Pulse Rate 86   Resp 16     Temp 97.8 F (36.6 C)     Temp Source Oral     SpO2 96 %     Weight 195 lb (88.5 kg)     Height 6\' 2"  (1.88 m)     Head Circumference      Peak Flow      Pain Score 5     Pain Loc      Pain Edu?      Excl. in GC?    Constitutional: Alert and oriented. Well appearing and in no distress. Eyes: Conjunctivae are normal. Normal extraocular movements. ENT      Head: Normocephalic and atraumatic.      Nose: No congestion/rhinnorhea.      Mouth/Throat: Mucous membranes are moist.      Neck: No stridor. Cardiovascular: Normal rate, regular rhythm. No murmurs, rubs, or gallops. Respiratory: Normal respiratory effort without tachypnea nor retractions. Breath sounds are clear and equal bilaterally. No wheezes/rales/rhonchi. Gastrointestinal: Soft and nontender. Normal bowel sounds Musculoskeletal: Nontender with normal range of motion in extremities. No lower extremity tenderness nor edema. Neurologic:  Normal speech and language. No gross focal neurologic deficits are appreciated.  Skin:  Skin is warm, dry and intact. No rash noted. Psychiatric: Mood and affect are normal. Speech and behavior are normal.  ____________________________________________  EKG: Interpreted by me.  Sinus rhythm with a rate of 90 bpm, normal PR interval, normal QRS, normal QT  ____________________________________________  ED COURSE:  As part of my medical decision making, I reviewed the following data within the electronic MEDICAL RECORD NUMBER History obtained from family if available, nursing notes, old chart and ekg, as well as notes from prior ED visits. Patient presented for chest pain, we will assess with labs and imaging as indicated at this time.   Procedures ____________________________________________   LABS (pertinent positives/negatives)  Labs Reviewed  BASIC METABOLIC PANEL - Abnormal; Notable for the following components:      Result Value   Potassium 3.4 (*)    Glucose, Bld 111 (*)     Calcium 8.8 (*)    All other components within normal limits  CBC - Abnormal; Notable for the following components:   WBC 10.8 (*)    All other components within normal limits  TROPONIN I    RADIOLOGY  Chest x-ray Is unremarkable ____________________________________________   DIFFERENTIAL DIAGNOSIS   Musculoskeletal pain, GERD, anxiety, PE, pneumothorax, unstable angina  FINAL ASSESSMENT AND PLAN  Chest pain   Plan: The patient had presented for nonspecific chest pain. Patient's labs were reassuring. Patient's imaging not reveal any acute process.  He is low risk for ACS, he is cleared for outpatient follow-up.  Ulice DashJohnathan E Yvonna Brun, MD    Note: This note was generated in part or whole with voice recognition software. Voice recognition is usually quite accurate but there are transcription errors that can and very often do occur. I apologize for any typographical errors that were not detected and corrected.     Emily FilbertWilliams, Emori Kamau E, MD 11/09/18 [phone]

## 2018-11-09 NOTE — ED Triage Notes (Signed)
Patient c/o dull right sided chest pain that started this AM with SOB. Ambulatory in triage with no problems. A&O x4 in triage

## 2018-11-09 NOTE — ED Triage Notes (Signed)
FIRST NURSE NOTE-here for chest pain that started this morning.  pulled for EKG.  Intermittent but getting worse.

## 2018-12-02 ENCOUNTER — Emergency Department
Admission: EM | Admit: 2018-12-02 | Discharge: 2018-12-02 | Disposition: A | Discharge status: Home / Self Care | Payer: Self-pay | Attending: Emergency Medicine | Admitting: Emergency Medicine

## 2018-12-02 ENCOUNTER — Encounter: Payer: Self-pay | Admitting: Emergency Medicine

## 2018-12-02 ENCOUNTER — Emergency Department: Payer: Self-pay

## 2018-12-02 ENCOUNTER — Other Ambulatory Visit: Payer: Self-pay

## 2018-12-02 DIAGNOSIS — Z202 Contact with and (suspected) exposure to infections with a predominantly sexual mode of transmission: Secondary | ICD-10-CM | POA: Insufficient documentation

## 2018-12-02 DIAGNOSIS — Z79899 Other long term (current) drug therapy: Secondary | ICD-10-CM | POA: Insufficient documentation

## 2018-12-02 DIAGNOSIS — J45909 Unspecified asthma, uncomplicated: Secondary | ICD-10-CM | POA: Insufficient documentation

## 2018-12-02 DIAGNOSIS — J40 Bronchitis, not specified as acute or chronic: Secondary | ICD-10-CM | POA: Insufficient documentation

## 2018-12-02 DIAGNOSIS — F17210 Nicotine dependence, cigarettes, uncomplicated: Secondary | ICD-10-CM | POA: Insufficient documentation

## 2018-12-02 LAB — URINALYSIS, COMPLETE (UACMP) WITH MICROSCOPIC
Bacteria, UA: NONE SEEN
Bilirubin Urine: NEGATIVE
Glucose, UA: NEGATIVE mg/dL
HGB URINE DIPSTICK: NEGATIVE
Ketones, ur: NEGATIVE mg/dL
Leukocytes,Ua: NEGATIVE
Nitrite: NEGATIVE
Protein, ur: NEGATIVE mg/dL
SPECIFIC GRAVITY, URINE: 1.016 (ref 1.005–1.030)
pH: 6 (ref 5.0–8.0)

## 2018-12-02 MED ORDER — AZITHROMYCIN 500 MG PO TABS
1000.0000 mg | ORAL_TABLET | Freq: Once | ORAL | Status: AC
  Administered 2018-12-02: 1000 mg via ORAL
  Filled 2018-12-02: qty 2

## 2018-12-02 MED ORDER — CEFTRIAXONE SODIUM 250 MG IJ SOLR
250.0000 mg | Freq: Once | INTRAMUSCULAR | Status: AC
  Administered 2018-12-02: 250 mg via INTRAMUSCULAR
  Filled 2018-12-02: qty 250

## 2018-12-02 MED ORDER — PREDNISONE 50 MG PO TABS: ORAL_TABLET | ORAL | 0 refills | Status: DC

## 2018-12-02 MED ORDER — METRONIDAZOLE 500 MG PO TABS
2000.0000 mg | ORAL_TABLET | Freq: Once | ORAL | Status: AC
  Administered 2018-12-02: 2000 mg via ORAL
  Filled 2018-12-02: qty 4

## 2018-12-02 MED ORDER — CEFTRIAXONE SODIUM 1 G IJ SOLR: 1.0000 g | Freq: Once | INTRAMUSCULAR | Status: DC

## 2018-12-02 NOTE — ED Triage Notes (Signed)
Patient ambulatory to triage with steady gait, without difficulty or distress noted ; pt reports prod cough white sputum, recently some pink tinged; denies fever, some sinus drainage; pk/day cigarette smoker; also st partner tested positive for chlamydia and wants to be checked, though asymptomatic

## 2018-12-02 NOTE — ED Provider Notes (Signed)
[facility] Emergency Department Provider Note  ____________________________________________  Time seen: Approximately 10:17 PM  I have reviewed the triage vital signs and the nursing notes.   HISTORY  Chief Complaint Cough    HPI Randall Shelton is a 38 y.o. male presents to the emergency department with productive cough for the past 3 weeks.  Patient became concerned as he has had blood-tinged sputum for the past 1 to 2 days.  Patient denies fever or chills at home. No SOB.  Patient reports that he smokes approximately 1 pack of cigarettes a day.  Patient is secondarily concerned about STD exposure.  Patient reports that his sexual partner was recently diagnosed with chlamydia.  Patient denies dysuria, hematuria, increased urinary frequency, low back pain or impotence.  No other alleviating measures have been attempted.   Past Medical History:  Diagnosis Date  . Asthma   . Chronic back pain   . Irregular heart beat   . Pancreatitis   . Proctocolitis     Patient Active Problem List   Diagnosis Date Noted  . Chronic back pain   . Irregular heart beat   . Proctocolitis   . Pancreatitis   . Asthma     Past Surgical History:  Procedure Laterality Date  . WISDOM TOOTH EXTRACTION Bilateral 2002    Prior to Admission medications   Medication Sig Start Date End Date Taking? Authorizing Provider  ondansetron (ZOFRAN-ODT) 4 MG disintegrating tablet Take 4 mg by mouth every 8 (eight) hours as needed for nausea. 09/19/17  Yes [provider]  albuterol (PROVENTIL HFA;VENTOLIN HFA) 108 (90 Base) MCG/ACT inhaler Inhale 2 puffs into the lungs every 6 (six) hours as needed for wheezing or shortness of breath. 06/06/17   Merrily Brittle, MD  albuterol (PROVENTIL) (2.5 MG/3ML) 0.083% nebulizer solution Take 3 mLs (2.5 mg total) by nebulization every 4 (four) hours as needed for wheezing or shortness of breath. 10/04/18   Irean Hong, MD   chlorpheniramine-HYDROcodone (TUSSIONEX PENNKINETIC ER) 10-8 MG/5ML SUER Take 5 mLs by mouth 2 (two) times daily. 10/04/18   Irean Hong, MD  predniSONE (DELTASONE) 50 MG tablet Take one 50 mg tablet once daily for the next five days. 12/02/18   Orvil Feil, PA-C  Spacer/Aero Chamber Mouthpiece MISC 1 Units by Does not apply route every 4 (four) hours as needed (wheezing). 06/06/17   Merrily Brittle, MD    Allergies Patient has no known allergies.  Family History  Problem Relation Age of Onset  . COPD Mother        Smoker  . Hiatal hernia Mother   . Heart disease Father   . Hypertension Father   . Diverticulitis Father   . Bone cancer Father   . Heart disease Sister   . Seizures Sister   . Healthy Sister     Social History Social History   Tobacco Use  . Smoking status: Current Every Day Smoker    Packs/day: 1.50    Types: Cigarettes  . Smokeless tobacco: Never Used  Substance Use Topics  . Alcohol use: Yes    Comment: Occasional  . Drug use: Yes    Types: Marijuana    Comment: Last Use- 06/11/17 (Per Patient- 06/12/17)     Review of Systems  Constitutional: No fever/chills Eyes: No visual changes. No discharge ENT: No upper respiratory complaints. Cardiovascular: no chest pain. Respiratory: Patient has cough.  No SOB. Gastrointestinal: No abdominal pain.  No nausea, no vomiting.  No diarrhea.  No constipation. Genitourinary: Negative for dysuria. No hematuria Musculoskeletal: Negative for musculoskeletal pain. Skin: Negative for rash, abrasions, lacerations, ecchymosis. Neurological: Negative for headaches, focal weakness or numbness.   ____________________________________________   PHYSICAL EXAM:  VITAL SIGNS: ED Triage Vitals  Enc Vitals Group     BP 12/02/18 1948 120/75     Pulse Rate 12/02/18 1948 74     Resp 12/02/18 1948 18     Temp 12/02/18 1948 98.4 F (36.9 C)     Temp Source 12/02/18 1948 Oral     SpO2 12/02/18 1948 97 %     Weight 12/02/18  1947 189 lb (85.7 kg)     Height 12/02/18 1947 6\' 1"  (1.854 m)     Head Circumference --      Peak Flow --      Pain Score 12/02/18 1948 0     Pain Loc --      Pain Edu? --      Excl. in GC? --      Constitutional: Alert and oriented. Well appearing and in no acute distress. Eyes: Conjunctivae are normal. PERRL. EOMI. Head: Atraumatic. ENT:      Ears: TMs are pearly.      Nose: No congestion/rhinnorhea.      Mouth/Throat: Mucous membranes are moist.  Neck: No stridor.  No cervical spine tenderness to palpation.  Cardiovascular: Normal rate, regular rhythm. Normal S1 and S2.  Good peripheral circulation. Respiratory: Normal respiratory effort without tachypnea or retractions. Lungs CTAB. Good air entry to the bases with no decreased or absent breath sounds. Musculoskeletal: Full range of motion to all extremities. No gross deformities appreciated. Neurologic:  Normal speech and language. No gross focal neurologic deficits are appreciated.  Skin:  Skin is warm, dry and intact. No rash noted. Psychiatric: Mood and affect are normal. Speech and behavior are normal. Patient exhibits appropriate insight and judgement.   ____________________________________________   LABS (all labs ordered are listed, but only abnormal results are displayed)  Labs Reviewed  CHLAMYDIA/NGC RT PCR ([facility] ONLY)  URINALYSIS, COMPLETE (UACMP) WITH MICROSCOPIC   ____________________________________________  EKG   ____________________________________________  RADIOLOGY I personally viewed and evaluated these images as part of my medical decision making, as well as reviewing the written report by the radiologist.  Dg Chest 2 View  Result Date: 12/02/2018 CLINICAL DATA:  Cough EXAM: CHEST - 2 VIEW COMPARISON:  11/09/2018 FINDINGS: The heart size and mediastinal contours are within normal limits. Both lungs are clear. The visualized skeletal structures are unremarkable. IMPRESSION: No active  cardiopulmonary disease. Electronically Signed   By: Jasmine Pang M.D.   On: 12/02/2018 20:03    ____________________________________________    PROCEDURES  Procedure(s) performed:    Procedures    Medications  azithromycin (ZITHROMAX) tablet 1,000 mg (has no administration in time range)  metroNIDAZOLE (FLAGYL) tablet 2,000 mg (has no administration in time range)  cefTRIAXone (ROCEPHIN) injection 250 mg (has no administration in time range)     ____________________________________________   INITIAL IMPRESSION / ASSESSMENT AND PLAN / ED COURSE  Pertinent labs & imaging results that were available during my care of the patient were reviewed by me and considered in my medical decision making (see chart for details).  Review of the [place] CSRS was performed in accordance of the [facility] prior to dispensing any controlled drugs.      Assessment and plan Bronchitis STD exposure Patient presents to the emergency department with cough productive for blood-tinged sputum for  the past 3 weeks.  Chest x-ray reveals no consolidations, opacities or infiltrates that would suggest community-acquired pneumonia.  Patient is secondarily concerned about exposure to chlamydia.  Patient was treated empirically with Rocephin, azithromycin and Flagyl.  Patient was discharged with prednisone for bronchitis.  He was advised to follow-up with local health department for full STD panel.  Strict return precautions were given to return for new or worsening symptoms.  All patient questions were answered.     ____________________________________________  FINAL CLINICAL IMPRESSION(S) / ED DIAGNOSES  Final diagnoses:  STD exposure  Bronchitis      NEW MEDICATIONS STARTED DURING THIS VISIT:  ED Discharge Orders         Ordered    predniSONE (DELTASONE) 50 MG tablet     12/02/18 2211              This chart was dictated using voice recognition software/Dragon. Despite best efforts to  proofread, errors can occur which can change the meaning. Any change was purely unintentional.    Orvil Feil, PA-C 12/02/18 2220    Phineas Semen, MD 12/02/18 2300

## 2018-12-03 LAB — CHLAMYDIA/NGC RT PCR (ARMC ONLY)
Chlamydia Tr: NOT DETECTED
N gonorrhoeae: NOT DETECTED

## 2019-12-02 ENCOUNTER — Encounter: Payer: Self-pay | Admitting: Intensive Care

## 2019-12-02 ENCOUNTER — Other Ambulatory Visit: Payer: Self-pay

## 2019-12-02 ENCOUNTER — Emergency Department
Admission: EM | Admit: 2019-12-02 | Discharge: 2019-12-02 | Disposition: A | Discharge status: Home / Self Care | Payer: Self-pay | Attending: Emergency Medicine | Admitting: Emergency Medicine

## 2019-12-02 ENCOUNTER — Ambulatory Visit: Payer: Self-pay | Admitting: Surgery

## 2019-12-02 ENCOUNTER — Emergency Department: Payer: Self-pay

## 2019-12-02 DIAGNOSIS — F17210 Nicotine dependence, cigarettes, uncomplicated: Secondary | ICD-10-CM | POA: Insufficient documentation

## 2019-12-02 DIAGNOSIS — F1210 Cannabis abuse, uncomplicated: Secondary | ICD-10-CM | POA: Insufficient documentation

## 2019-12-02 DIAGNOSIS — K611 Rectal abscess: Secondary | ICD-10-CM | POA: Insufficient documentation

## 2019-12-02 DIAGNOSIS — Z20822 Contact with and (suspected) exposure to covid-19: Secondary | ICD-10-CM | POA: Insufficient documentation

## 2019-12-02 DIAGNOSIS — L089 Local infection of the skin and subcutaneous tissue, unspecified: Secondary | ICD-10-CM | POA: Insufficient documentation

## 2019-12-02 DIAGNOSIS — J45909 Unspecified asthma, uncomplicated: Secondary | ICD-10-CM | POA: Insufficient documentation

## 2019-12-02 LAB — CBC WITH DIFFERENTIAL/PLATELET
Abs Immature Granulocytes: 0.09 10*3/uL — ABNORMAL HIGH (ref 0.00–0.07)
Basophils Absolute: 0.1 10*3/uL (ref 0.0–0.1)
Basophils Relative: 1 %
Eosinophils Absolute: 0.3 10*3/uL (ref 0.0–0.5)
Eosinophils Relative: 2 %
HCT: 39.5 % (ref 39.0–52.0)
Hemoglobin: 13.9 g/dL (ref 13.0–17.0)
Immature Granulocytes: 1 %
Lymphocytes Relative: 16 %
Lymphs Abs: 2.3 10*3/uL (ref 0.7–4.0)
MCH: 30.3 pg (ref 26.0–34.0)
MCHC: 35.2 g/dL (ref 30.0–36.0)
MCV: 86.2 fL (ref 80.0–100.0)
Monocytes Absolute: 1.3 10*3/uL — ABNORMAL HIGH (ref 0.1–1.0)
Monocytes Relative: 9 %
Neutro Abs: 10.7 10*3/uL — ABNORMAL HIGH (ref 1.7–7.7)
Neutrophils Relative %: 71 %
Platelets: 216 10*3/uL (ref 150–400)
RBC: 4.58 MIL/uL (ref 4.22–5.81)
RDW: 12.6 % (ref 11.5–15.5)
WBC: 14.8 10*3/uL — ABNORMAL HIGH (ref 4.0–10.5)
nRBC: 0 % (ref 0.0–0.2)

## 2019-12-02 LAB — BASIC METABOLIC PANEL
Anion gap: 7 (ref 5–15)
BUN: 13 mg/dL (ref 6–20)
CO2: 23 mmol/L (ref 22–32)
Calcium: 8.5 mg/dL — ABNORMAL LOW (ref 8.9–10.3)
Chloride: 106 mmol/L (ref 98–111)
Creatinine, Ser: 0.95 mg/dL (ref 0.61–1.24)
GFR calc Af Amer: >60 mL/min (ref >60)
GFR calc non Af Amer: >60 mL/min (ref >60)
Glucose, Bld: 100 mg/dL — ABNORMAL HIGH (ref 70–99)
Potassium: 3.6 mmol/L (ref 3.5–5.1)
Sodium: 136 mmol/L (ref 135–145)

## 2019-12-02 LAB — SARS CORONAVIRUS 2 (TAT 6-24 HRS): SARS Coronavirus 2: NEGATIVE

## 2019-12-02 MED ORDER — OXYCODONE-ACETAMINOPHEN 5-325 MG PO TABS
1.0000 | ORAL_TABLET | Freq: Once | ORAL | Status: AC
  Administered 2019-12-02: 12:00:00 1 via ORAL
  Filled 2019-12-02: qty 1

## 2019-12-02 MED ORDER — SODIUM CHLORIDE 0.9 % IV SOLN
1.0000 g | INTRAVENOUS | Status: AC
  Administered 2019-12-03: 1000 mg via INTRAVENOUS
  Filled 2019-12-02 (×2): qty 1

## 2019-12-02 MED ORDER — OXYCODONE-ACETAMINOPHEN 5-325 MG PO TABS: 1.0000 | ORAL_TABLET | ORAL | 0 refills | Status: AC | PRN

## 2019-12-02 MED ORDER — IOHEXOL 300 MG/ML  SOLN
100.0000 mL | Freq: Once | INTRAMUSCULAR | Status: AC | PRN
  Administered 2019-12-02: 12:00:00 100 mL via INTRAVENOUS
  Filled 2019-12-02: qty 100

## 2019-12-02 MED ORDER — IOHEXOL 9 MG/ML PO SOLN
500.0000 mL | Freq: Once | ORAL | Status: AC
  Administered 2019-12-02: 11:00:00 1000 mL via ORAL
  Filled 2019-12-02: qty 500

## 2019-12-02 MED ORDER — SULFAMETHOXAZOLE-TRIMETHOPRIM 800-160 MG PO TABS: 1.0000 | ORAL_TABLET | Freq: Two times a day (BID) | ORAL | 0 refills | Status: DC

## 2019-12-02 NOTE — Discharge Instructions (Signed)
Report to the [facility] tomorrow morning at 11:30 am for your surgery. Take the antibiotic as directed and the pain medicine as needed.

## 2019-12-02 NOTE — ED Triage Notes (Signed)
C/o boil on left buttocks that appeared yesterday. Denies drainage

## 2019-12-02 NOTE — H&P (Signed)
Patient ID: Randall Shelton, male   DOB: 06/22/1981, 38 y.o.   MRN: [id number]  Chief Complaint: Perianal pain.  History of Present Illness Randall Shelton is a 38 y.o. male with a 2-day history of perianal pain.  Known prior history of recurring perianal/perirectal abscesses, history of prior I&D.  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  He notes beginning to have pain 2 days ago, awoke with fever and worsening pain yesterday.  He presented to the ED today, had a CT scan confirming left-sided perianal abscess in the soft tissues.  White blood cell count of 14.  Due to social constraints/personal issues he desires to defer incision and drainage until tomorrow.  Although I have advised against this, I will attempt to assist him in any way I can.  Past Medical History Past Medical History:  Diagnosis Date  . Asthma   . Chronic back pain   . Irregular heart beat   . Pancreatitis   . Proctocolitis       Past Surgical History:  Procedure Laterality Date  . WISDOM TOOTH EXTRACTION Bilateral 2002    No Known Allergies  Current Outpatient Medications  Medication Sig Dispense Refill  . albuterol (PROVENTIL HFA;VENTOLIN HFA) 108 (90 Base) MCG/ACT inhaler Inhale 2 puffs into the lungs every 6 (six) hours as needed for wheezing or shortness of breath. 1 Inhaler 0  . albuterol (PROVENTIL) (2.5 MG/3ML) 0.083% nebulizer solution Take 3 mLs (2.5 mg total) by nebulization every 4 (four) hours as needed for wheezing or shortness of breath. 75 mL 0  . chlorpheniramine-HYDROcodone (TUSSIONEX PENNKINETIC ER) 10-8 MG/5ML SUER Take 5 mLs by mouth 2 (two) times daily. 70 mL 0  . ondansetron (ZOFRAN-ODT) 4 MG disintegrating tablet Take 4 mg by mouth every 8 (eight) hours as needed for nausea.    . predniSONE (DELTASONE) 50 MG tablet Take one 50 mg tablet once daily for the next five  days. 5 tablet 0  . Spacer/Aero Chamber Mouthpiece MISC 1 Units by Does not apply route every 4 (four) hours as needed (wheezing). 1 each 0   No current facility-administered medications for this visit.    Family History Family History  Problem Relation Age of Onset  . COPD Mother        Smoker  . Hiatal hernia Mother   . Heart disease Father   . Hypertension Father   . Diverticulitis Father   . Bone cancer Father   . Heart disease Sister   . Seizures Sister   . Healthy Sister       Social History Social History   Tobacco Use  . Smoking status: Current Every Day Smoker    Packs/day: 1.50    Types: Cigarettes  . Smokeless tobacco: Never Used  Substance Use Topics  . Alcohol use: Yes    Comment: Occasional  . Drug use: Yes    Types: Marijuana        Review of Systems  Constitutional: Positive for fever. Negative for chills.  HENT: Negative.   Eyes: Negative.   Respiratory: Negative.   Cardiovascular: Negative.   Gastrointestinal: Positive for diarrhea. Negative for constipation, nausea and vomiting.  Genitourinary: Negative.   Musculoskeletal: Negative.   Skin: Negative.   Neurological: Negative.   Endo/Heme/Allergies: Negative.       Physical Exam There were no vitals taken for this visit.   CONSTITUTIONAL: Well   developed, and nourished, appropriately responsive and aware without distress.   EYES: Sclera non-icteric.   EARS, NOSE, MOUTH AND THROAT: Mask worn.  Hearing is intact to voice.  NECK: Trachea is midline, and there is no jugular venous distension.  LYMPH NODES:  Lymph nodes in the neck are not enlarged. RESPIRATORY:  Lungs are clear, and breath sounds are equal bilaterally. Normal respiratory effort without pathologic use of accessory muscles. CARDIOVASCULAR: Heart is regular in rate and rhythm. GI: The abdomen is soft, nontender, and nondistended. There were no palpable masses. GU: There is a left-sided area of fluctuance with surrounding  edema, associated tenderness in the adjacent buttock soft tissues. MUSCULOSKELETAL:  Symmetrical muscle tone appreciated in all four extremities.    SKIN: Skin turgor is normal. No pathologic skin lesions appreciated.  NEUROLOGIC:  Motor and sensation appear grossly normal.  Cranial nerves are grossly without defect. PSYCH:  Alert and oriented to person, place and time. Affect is appropriate for situation.  Data Reviewed I have personally reviewed what is currently available of the patient's imaging, recent labs and medical records.   Labs:  CBC Latest Ref Rng & Units 12/02/2019 11/09/2018 06/05/2017  WBC 4.0 - 10.5 K/uL 14.8(H) 10.8(H) 15.2(H)  Hemoglobin 13.0 - 17.0 g/dL 50.0 37.0 48.8  Hematocrit 39.0 - 52.0 % 39.5 43.0 40.0  Platelets 150 - 400 K/uL 216 251 236   CMP Latest Ref Rng & Units 12/02/2019 11/09/2018 06/05/2017  Glucose 70 - 99 mg/dL 891(Q) 945(W) 388(E)  BUN 6 - 20 mg/dL 13 15 14   Creatinine 0.61 - 1.24 mg/dL 2.80 0.34)  Sodium 135 - 145 mmol/L 136 137 140  Potassium 3.5 - 5.1 mmol/L 3.6 3.4(L) 3.9  Chloride 98 - 111 mmol/L 106 107 106  CO2 22 - 32 mmol/L 23 22 27   Calcium 8.9 - 10.3 mg/dL 9.17(H) ) 1.5(A)  Total Protein 6.5 - 8.1 g/dL - - 6.9  Total Bilirubin 0.3 - 1.2 mg/dL - - 5.6(P)  Alkaline Phos 38 - 126 U/L - - 83  AST 15 - 41 U/L - - 22  ALT 17 - 63 U/L - - 21      Imaging:  Within last 24 hrs: CT ABDOMEN PELVIS W CONTRAST  Result Date: 12/02/2019 CLINICAL DATA:  Abscess of anal and rectal regions. EXAM: CT ABDOMEN AND PELVIS WITH CONTRAST TECHNIQUE: Multidetector CT imaging of the abdomen and pelvis was performed using the standard protocol following bolus administration of intravenous contrast. CONTRAST:  8.0(X OMNIPAQUE IOHEXOL 300 MG/ML  SOLN COMPARISON:  June 06, 2017. FINDINGS: Lower chest: No acute abnormality. Hepatobiliary: No gallstones or biliary dilatation is noted. Stable right hepatic cysts are noted. Pancreas: Unremarkable. No pancreatic  ductal dilatation or surrounding inflammatory changes. Spleen: Normal in size without focal abnormality. Adrenals/Urinary Tract: Adrenal glands are unremarkable. Kidneys are normal, without renal calculi, focal lesion, or hydronephrosis. Bladder is unremarkable. Stomach/Bowel: Stomach is within normal limits. Appendix appears normal. No evidence of bowel wall thickening, distention, or inflammatory changes. Vascular/Lymphatic: Aortic atherosclerosis. No enlarged abdominal or pelvic lymph nodes. Reproductive: Prostate is unremarkable. Other: No hernia is noted. 36 x 26 mm low density with ill-defined margins is seen extending from the left perianal region into the left buttocks region consistent with abscess or developing abscess. Musculoskeletal: No acute or significant osseous findings. IMPRESSION: 36 x 26 mm low density with ill-defined margins is seen extending from the left perianal region into the left buttocks region consistent with abscess or developing abscess. Aortic Atherosclerosis (  ICD10-I70.0). Electronically Signed   By: Marijo Conception M.D.   On: 12/02/2019 12:14    Assessment    Perianal abscess. Patient Active Problem List   Diagnosis Date Noted  . Chronic back pain   . Irregular heart beat   . Proctocolitis   . Pancreatitis   . Asthma     Plan    This gentleman needs an I&D, but at his request I have worked with him to defer it until tomorrow as it appears the operating room availability is not until later this evening. We will proceed with Covid testing, initiate continue antibiotics and pain medications.  We will have him return to same day surgery for ambulatory surgery tomorrow.  Face-to-face time spent with the patient in the emergency room and accompanying care providers(if present) was 30 minutes, with more than 50% of the time spent counseling, educating, and coordinating care of the patient.      Ronny Bacon M.D., FACS 12/02/2019, 1:47 PM

## 2019-12-02 NOTE — ED Provider Notes (Signed)
[facility] Emergency Department Provider Note  ____________________________________________  Time seen: Approximately 11:36 AM  I have reviewed the triage vital signs and the nursing notes.   HISTORY  Chief Complaint Recurrent Skin Infections    HPI Randall Shelton is a 39 y.o. male that presents to the emergency department for evaluation of abscess near rectum on left buttocks for 2 days.  Patient states that he has had chills and felt hot.  He has had abscesses in this area previously that have required drainage.  No drainage currently.  Past Medical History:  Diagnosis Date  . Asthma   . Chronic back pain   . Irregular heart beat   . Pancreatitis   . Proctocolitis     Patient Active Problem List   Diagnosis Date Noted  . Chronic back pain   . Irregular heart beat   . Proctocolitis   . Pancreatitis   . Asthma     Past Surgical History:  Procedure Laterality Date  . WISDOM TOOTH EXTRACTION Bilateral 2002    Prior to Admission medications   Medication Sig Start Date End Date Taking? Authorizing Provider  albuterol (PROVENTIL HFA;VENTOLIN HFA) 108 (90 Base) MCG/ACT inhaler Inhale 2 puffs into the lungs every 6 (six) hours as needed for wheezing or shortness of breath. 06/06/17   Darel Hong, MD  albuterol (PROVENTIL) (2.5 MG/3ML) 0.083% nebulizer solution Take 3 mLs (2.5 mg total) by nebulization every 4 (four) hours as needed for wheezing or shortness of breath. 10/04/18   Paulette Blanch, MD  chlorpheniramine-HYDROcodone (TUSSIONEX PENNKINETIC ER) 10-8 MG/5ML SUER Take 5 mLs by mouth 2 (two) times daily. 10/04/18   Paulette Blanch, MD  ondansetron (ZOFRAN-ODT) 4 MG disintegrating tablet Take 4 mg by mouth every 8 (eight) hours as needed for nausea. 09/19/17   [provider]  predniSONE (DELTASONE) 50 MG tablet Take one 50 mg tablet once daily for the next five days. 12/02/18   Lannie Fields, PA-C  Spacer/Aero Chamber Mouthpiece MISC 1 Units by  Does not apply route every 4 (four) hours as needed (wheezing). 06/06/17   Darel Hong, MD    Allergies Patient has no known allergies.  Family History  Problem Relation Age of Onset  . COPD Mother        Smoker  . Hiatal hernia Mother   . Heart disease Father   . Hypertension Father   . Diverticulitis Father   . Bone cancer Father   . Heart disease Sister   . Seizures Sister   . Healthy Sister     Social History Social History   Tobacco Use  . Smoking status: Current Every Day Smoker    Packs/day: 1.50    Types: Cigarettes  . Smokeless tobacco: Never Used  Substance Use Topics  . Alcohol use: Yes    Comment: Occasional  . Drug use: Yes    Types: Marijuana     Review of Systems  Respiratory: No SOB. Gastrointestinal: No abdominal pain.  No nausea, no vomiting.  Musculoskeletal: Negative for musculoskeletal pain.  Positive for rash. Skin: Negative fabrasions, lacerations, ecchymosis. Neurological: Negative for headaches   ____________________________________________   PHYSICAL EXAM:  VITAL SIGNS: ED Triage Vitals  Enc Vitals Group     BP 12/02/19 1011 123/74     Pulse Rate 12/02/19 1011 100     Resp 12/02/19 1011 14     Temp 12/02/19 1011 98.3 F (36.8 C)     Temp Source 12/02/19 1011  Oral     SpO2 12/02/19 1011 95 %     Weight 12/02/19 1012 185 lb (83.9 kg)     Height 12/02/19 1012 6\' 1"  (1.854 m)     Head Circumference --      Peak Flow --      Pain Score 12/02/19 1012 7     Pain Loc --      Pain Edu? --      Excl. in GC? --      Constitutional: Alert and oriented. Well appearing and in no acute distress. Eyes: Conjunctivae are normal. PERRL. EOMI. Head: Atraumatic. ENT:      Ears:      Nose: No congestion/rhinnorhea.      Mouth/Throat: Mucous membranes are moist.  Neck: No stridor.   Cardiovascular: Normal rate, regular rhythm.  Good peripheral circulation. Respiratory: Normal respiratory effort without tachypnea or retractions. Lungs  CTAB. Good air entry to the bases with no decreased or absent breath sounds. Musculoskeletal: Full range of motion to all extremities. No gross deformities appreciated. Neurologic:  Normal speech and language. No gross focal neurologic deficits are appreciated.  Skin:  Skin is warm, dry and intact.  Tenderness to palpation to left buttocks proximal to rectum with mild overlying erythema. Psychiatric: Mood and affect are normal. Speech and behavior are normal. Patient exhibits appropriate insight and judgement.   ____________________________________________   LABS (all labs ordered are listed, but only abnormal results are displayed)  Labs Reviewed  CBC WITH DIFFERENTIAL/PLATELET - Abnormal; Notable for the following components:      Result Value   WBC 14.8 (*)    Neutro Abs 10.7 (*)    Monocytes Absolute 1.3 (*)    Abs Immature Granulocytes 0.09 (*)    All other components within normal limits  BASIC METABOLIC PANEL - Abnormal; Notable for the following components:   Glucose, Bld 100 (*)    Calcium 8.5 (*)    All other components within normal limits   ____________________________________________  EKG   ____________________________________________  RADIOLOGY  CT ABDOMEN PELVIS W CONTRAST  Result Date: 12/02/2019 CLINICAL DATA:  Abscess of anal and rectal regions. EXAM: CT ABDOMEN AND PELVIS WITH CONTRAST TECHNIQUE: Multidetector CT imaging of the abdomen and pelvis was performed using the standard protocol following bolus administration of intravenous contrast. CONTRAST:  02/01/2020 OMNIPAQUE IOHEXOL 300 MG/ML  SOLN COMPARISON:  June 06, 2017. FINDINGS: Lower chest: No acute abnormality. Hepatobiliary: No gallstones or biliary dilatation is noted. Stable right hepatic cysts are noted. Pancreas: Unremarkable. No pancreatic ductal dilatation or surrounding inflammatory changes. Spleen: Normal in size without focal abnormality. Adrenals/Urinary Tract: Adrenal glands are unremarkable.  Kidneys are normal, without renal calculi, focal lesion, or hydronephrosis. Bladder is unremarkable. Stomach/Bowel: Stomach is within normal limits. Appendix appears normal. No evidence of bowel wall thickening, distention, or inflammatory changes. Vascular/Lymphatic: Aortic atherosclerosis. No enlarged abdominal or pelvic lymph nodes. Reproductive: Prostate is unremarkable. Other: No hernia is noted. 36 x 26 mm low density with ill-defined margins is seen extending from the left perianal region into the left buttocks region consistent with abscess or developing abscess. Musculoskeletal: No acute or significant osseous findings. IMPRESSION: 36 x 26 mm low density with ill-defined margins is seen extending from the left perianal region into the left buttocks region consistent with abscess or developing abscess. Aortic Atherosclerosis (ICD10-I70.0). Electronically Signed   By: June 08, 2017 M.D.   On: 12/02/2019 12:14    ____________________________________________    PROCEDURES  Procedure(s) performed:  Procedures    Medications  iohexol (OMNIPAQUE) 9 MG/ML oral solution 500 mL (1,000 mLs Oral Contrast Given 12/02/19 1118)  oxyCODONE-acetaminophen (PERCOCET/ROXICET) 5-325 MG per tablet 1 tablet (1 tablet Oral Given 12/02/19 1136)  iohexol (OMNIPAQUE) 300 MG/ML solution 100 mL (100 mLs Intravenous Contrast Given 12/02/19 1154)     ____________________________________________   INITIAL IMPRESSION / ASSESSMENT AND PLAN / ED COURSE  Pertinent labs & imaging results that were available during my care of the patient were reviewed by me and considered in my medical decision making (see chart for details).  Review of the [place] CSRS was performed in accordance of the [facility] prior to dispensing any controlled drugs.   Patient presented to the emergency department for evaluation of possible perirectal abscess.  Vital signs and exam are reassuring.  Patient has a leukocytosis of 14.8.  CT scan  consistent with possible abscess or developing abscess.  Dr. Cyril Loosen was consulted and came to personally evaluate the patient.  He spoke with general surgery on the phone and Dr. Claudine Mouton with general surgery will come to evaluate the patient. Care was transferred to [facility], PA-C for general surgery plan and disposition.   Randall Shelton was evaluated in Emergency Department on 12/02/2019 for the symptoms described in the history of present illness. He was evaluated in the context of the global COVID-19 pandemic, which necessitated consideration that the patient might be at risk for infection with the SARS-CoV-2 virus that causes COVID-19. Institutional protocols and algorithms that pertain to the evaluation of patients at risk for COVID-19 are in a state of rapid change based on information released by regulatory bodies including the CDC and federal and state organizations. These policies and algorithms were followed during the patient's care in the ED.   ____________________________________________  FINAL CLINICAL IMPRESSION(S) / ED DIAGNOSES  Final diagnoses:  None      NEW MEDICATIONS STARTED DURING THIS VISIT:  ED Discharge Orders    None          This chart was dictated using voice recognition software/Dragon. Despite best efforts to proofread, errors can occur which can change the meaning. Any change was purely unintentional.    Enid Derry, PA-C 12/02/19 1333    Jene Every, MD 12/02/19 [phone]

## 2019-12-02 NOTE — H&P (View-Only) (Signed)
Patient ID: Randall Shelton, male   DOB: 07-08-1981, 39 y.o.   MRN: [id number]  Chief Complaint: Perianal pain.  History of Present Illness Randall Shelton is a 39 y.o. male with a 2-day history of perianal pain.  Known prior history of recurring perianal/perirectal abscesses, history of prior I&D.  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  He notes beginning to have pain 2 days ago, awoke with fever and worsening pain yesterday.  He presented to the ED today, had a CT scan confirming left-sided perianal abscess in the soft tissues.  White blood cell count of 14.  Due to social constraints/personal issues he desires to defer incision and drainage until tomorrow.  Although I have advised against this, I will attempt to assist him in any way I can.  Past Medical History Past Medical History:  Diagnosis Date  . Asthma   . Chronic back pain   . Irregular heart beat   . Pancreatitis   . Proctocolitis       Past Surgical History:  Procedure Laterality Date  . WISDOM TOOTH EXTRACTION Bilateral 2002    No Known Allergies  Current Outpatient Medications  Medication Sig Dispense Refill  . albuterol (PROVENTIL HFA;VENTOLIN HFA) 108 (90 Base) MCG/ACT inhaler Inhale 2 puffs into the lungs every 6 (six) hours as needed for wheezing or shortness of breath. 1 Inhaler 0  . albuterol (PROVENTIL) (2.5 MG/3ML) 0.083% nebulizer solution Take 3 mLs (2.5 mg total) by nebulization every 4 (four) hours as needed for wheezing or shortness of breath. 75 mL 0  . chlorpheniramine-HYDROcodone (TUSSIONEX PENNKINETIC ER) 10-8 MG/5ML SUER Take 5 mLs by mouth 2 (two) times daily. 70 mL 0  . ondansetron (ZOFRAN-ODT) 4 MG disintegrating tablet Take 4 mg by mouth every 8 (eight) hours as needed for nausea.    . predniSONE (DELTASONE) 50 MG tablet Take one 50 mg tablet once daily for the next five  days. 5 tablet 0  . Spacer/Aero Chamber Mouthpiece MISC 1 Units by Does not apply route every 4 (four) hours as needed (wheezing). 1 each 0   No current facility-administered medications for this visit.    Family History Family History  Problem Relation Age of Onset  . COPD Mother        Smoker  . Hiatal hernia Mother   . Heart disease Father   . Hypertension Father   . Diverticulitis Father   . Bone cancer Father   . Heart disease Sister   . Seizures Sister   . Healthy Sister       Social History Social History   Tobacco Use  . Smoking status: Current Every Day Smoker    Packs/day: 1.50    Types: Cigarettes  . Smokeless tobacco: Never Used  Substance Use Topics  . Alcohol use: Yes    Comment: Occasional  . Drug use: Yes    Types: Marijuana        Review of Systems  Constitutional: Positive for fever. Negative for chills.  HENT: Negative.   Eyes: Negative.   Respiratory: Negative.   Cardiovascular: Negative.   Gastrointestinal: Positive for diarrhea. Negative for constipation, nausea and vomiting.  Genitourinary: Negative.   Musculoskeletal: Negative.   Skin: Negative.   Neurological: Negative.   Endo/Heme/Allergies: Negative.       Physical Exam There were no vitals taken for this visit.   CONSTITUTIONAL: Well  developed, and nourished, appropriately responsive and aware without distress.   EYES: Sclera non-icteric.   EARS, NOSE, MOUTH AND THROAT: Mask worn.  Hearing is intact to voice.  NECK: Trachea is midline, and there is no jugular venous distension.  LYMPH NODES:  Lymph nodes in the neck are not enlarged. RESPIRATORY:  Lungs are clear, and breath sounds are equal bilaterally. Normal respiratory effort without pathologic use of accessory muscles. CARDIOVASCULAR: Heart is regular in rate and rhythm. GI: The abdomen is soft, nontender, and nondistended. There were no palpable masses. GU: There is a left-sided area of fluctuance with surrounding  edema, associated tenderness in the adjacent buttock soft tissues. MUSCULOSKELETAL:  Symmetrical muscle tone appreciated in all four extremities.    SKIN: Skin turgor is normal. No pathologic skin lesions appreciated.  NEUROLOGIC:  Motor and sensation appear grossly normal.  Cranial nerves are grossly without defect. PSYCH:  Alert and oriented to person, place and time. Affect is appropriate for situation.  Data Reviewed I have personally reviewed what is currently available of the patient's imaging, recent labs and medical records.   Labs:  CBC Latest Ref Rng & Units 12/02/2019 11/09/2018 06/05/2017  WBC 4.0 - 10.5 K/uL 14.8(H) 10.8(H) 15.2(H)  Hemoglobin 13.0 - 17.0 g/dL 50.0 37.0 48.8  Hematocrit 39.0 - 52.0 % 39.5 43.0 40.0  Platelets 150 - 400 K/uL 216 251 236   CMP Latest Ref Rng & Units 12/02/2019 11/09/2018 06/05/2017  Glucose 70 - 99 mg/dL 891(Q) 945(W) 388(E)  BUN 6 - 20 mg/dL 13 15 14   Creatinine 0.61 - 1.24 mg/dL 2.80 0.34)  Sodium 135 - 145 mmol/L 136 137 140  Potassium 3.5 - 5.1 mmol/L 3.6 3.4(L) 3.9  Chloride 98 - 111 mmol/L 106 107 106  CO2 22 - 32 mmol/L 23 22 27   Calcium 8.9 - 10.3 mg/dL 9.17(H) ) 1.5(A)  Total Protein 6.5 - 8.1 g/dL - - 6.9  Total Bilirubin 0.3 - 1.2 mg/dL - - 5.6(P)  Alkaline Phos 38 - 126 U/L - - 83  AST 15 - 41 U/L - - 22  ALT 17 - 63 U/L - - 21      Imaging:  Within last 24 hrs: CT ABDOMEN PELVIS W CONTRAST  Result Date: 12/02/2019 CLINICAL DATA:  Abscess of anal and rectal regions. EXAM: CT ABDOMEN AND PELVIS WITH CONTRAST TECHNIQUE: Multidetector CT imaging of the abdomen and pelvis was performed using the standard protocol following bolus administration of intravenous contrast. CONTRAST:  8.0(X OMNIPAQUE IOHEXOL 300 MG/ML  SOLN COMPARISON:  June 06, 2017. FINDINGS: Lower chest: No acute abnormality. Hepatobiliary: No gallstones or biliary dilatation is noted. Stable right hepatic cysts are noted. Pancreas: Unremarkable. No pancreatic  ductal dilatation or surrounding inflammatory changes. Spleen: Normal in size without focal abnormality. Adrenals/Urinary Tract: Adrenal glands are unremarkable. Kidneys are normal, without renal calculi, focal lesion, or hydronephrosis. Bladder is unremarkable. Stomach/Bowel: Stomach is within normal limits. Appendix appears normal. No evidence of bowel wall thickening, distention, or inflammatory changes. Vascular/Lymphatic: Aortic atherosclerosis. No enlarged abdominal or pelvic lymph nodes. Reproductive: Prostate is unremarkable. Other: No hernia is noted. 36 x 26 mm low density with ill-defined margins is seen extending from the left perianal region into the left buttocks region consistent with abscess or developing abscess. Musculoskeletal: No acute or significant osseous findings. IMPRESSION: 36 x 26 mm low density with ill-defined margins is seen extending from the left perianal region into the left buttocks region consistent with abscess or developing abscess. Aortic Atherosclerosis (  ICD10-I70.0). Electronically Signed   By: Marijo Conception M.D.   On: 12/02/2019 12:14    Assessment    Perianal abscess. Patient Active Problem List   Diagnosis Date Noted  . Chronic back pain   . Irregular heart beat   . Proctocolitis   . Pancreatitis   . Asthma     Plan    This gentleman needs an I&D, but at his request I have worked with him to defer it until tomorrow as it appears the operating room availability is not until later this evening. We will proceed with Covid testing, initiate continue antibiotics and pain medications.  We will have him return to same day surgery for ambulatory surgery tomorrow.  Face-to-face time spent with the patient in the emergency room and accompanying care providers(if present) was 30 minutes, with more than 50% of the time spent counseling, educating, and coordinating care of the patient.      Ronny Bacon M.D., FACS 12/02/2019, 1:47 PM

## 2019-12-03 ENCOUNTER — Encounter
Admission: RE | Disposition: A | Discharge status: Home / Self Care | Payer: Self-pay | Source: Ambulatory Visit | Attending: Surgery

## 2019-12-03 ENCOUNTER — Encounter: Payer: Self-pay | Admitting: Surgery

## 2019-12-03 ENCOUNTER — Ambulatory Visit
Admission: RE | Admit: 2019-12-03 | Discharge: 2019-12-03 | Disposition: A | Discharge status: Home / Self Care | Payer: Self-pay | Source: Ambulatory Visit | Attending: Surgery | Admitting: Surgery

## 2019-12-03 ENCOUNTER — Other Ambulatory Visit: Payer: Self-pay

## 2019-12-03 ENCOUNTER — Ambulatory Visit: Payer: Self-pay | Admitting: Certified Registered Nurse Anesthetist

## 2019-12-03 DIAGNOSIS — K8681 Exocrine pancreatic insufficiency: Secondary | ICD-10-CM | POA: Insufficient documentation

## 2019-12-03 DIAGNOSIS — K610 Anal abscess: Secondary | ICD-10-CM | POA: Insufficient documentation

## 2019-12-03 DIAGNOSIS — Z79899 Other long term (current) drug therapy: Secondary | ICD-10-CM | POA: Insufficient documentation

## 2019-12-03 DIAGNOSIS — K611 Rectal abscess: Secondary | ICD-10-CM

## 2019-12-03 DIAGNOSIS — F17210 Nicotine dependence, cigarettes, uncomplicated: Secondary | ICD-10-CM | POA: Insufficient documentation

## 2019-12-03 DIAGNOSIS — J45909 Unspecified asthma, uncomplicated: Secondary | ICD-10-CM | POA: Insufficient documentation

## 2019-12-03 LAB — URINE DRUG SCREEN, QUALITATIVE (ARMC ONLY)
Amphetamines, Ur Screen: NOT DETECTED
Barbiturates, Ur Screen: NOT DETECTED
Benzodiazepine, Ur Scrn: NOT DETECTED
Cannabinoid 50 Ng, Ur ~~LOC~~: POSITIVE — AB
Cocaine Metabolite,Ur ~~LOC~~: POSITIVE — AB
MDMA (Ecstasy)Ur Screen: NOT DETECTED
Methadone Scn, Ur: NOT DETECTED
Opiate, Ur Screen: POSITIVE — AB
Phencyclidine (PCP) Ur S: NOT DETECTED
Tricyclic, Ur Screen: NOT DETECTED

## 2019-12-03 SURGERY — INCISION AND DRAINAGE, ABSCESS, PERIRECTAL
Anesthesia: General | Laterality: Left

## 2019-12-03 MED ORDER — BUPIVACAINE LIPOSOME 1.3 % IJ SUSP
INTRAMUSCULAR | Status: AC
  Filled 2019-12-03: qty 20

## 2019-12-03 MED ORDER — MIDAZOLAM HCL 2 MG/2ML IJ SOLN
2.0000 mg | Freq: Once | INTRAMUSCULAR | Status: AC
  Administered 2019-12-03: 17:00:00 2 mg via INTRAVENOUS

## 2019-12-03 MED ORDER — CHLORHEXIDINE GLUCONATE CLOTH 2 % EX PADS
6.0000 | MEDICATED_PAD | Freq: Once | CUTANEOUS | Status: AC
  Administered 2019-12-03: 12:00:00 6 via TOPICAL

## 2019-12-03 MED ORDER — GABAPENTIN 300 MG PO CAPS: 300.0000 mg | ORAL_CAPSULE | ORAL | Status: AC

## 2019-12-03 MED ORDER — ACETAMINOPHEN 500 MG PO TABS
ORAL_TABLET | ORAL | Status: AC
  Administered 2019-12-03: 12:00:00 1000 mg via ORAL
  Filled 2019-12-03: qty 2

## 2019-12-03 MED ORDER — FENTANYL CITRATE (PF) 100 MCG/2ML IJ SOLN
50.0000 ug | Freq: Once | INTRAMUSCULAR | Status: AC
  Administered 2019-12-03: 17:00:00 50 ug via INTRAVENOUS

## 2019-12-03 MED ORDER — LACTATED RINGERS IV SOLN: INTRAVENOUS | Status: DC

## 2019-12-03 MED ORDER — CELECOXIB 200 MG PO CAPS: 200.0000 mg | ORAL_CAPSULE | ORAL | Status: AC

## 2019-12-03 MED ORDER — CELECOXIB 200 MG PO CAPS
ORAL_CAPSULE | ORAL | Status: AC
  Administered 2019-12-03: 12:00:00 200 mg via ORAL
  Filled 2019-12-03: qty 1

## 2019-12-03 MED ORDER — CHLORHEXIDINE GLUCONATE CLOTH 2 % EX PADS
6.0000 | MEDICATED_PAD | Freq: Once | CUTANEOUS | Status: AC
  Administered 2019-12-03: 6 via TOPICAL

## 2019-12-03 MED ORDER — FENTANYL CITRATE (PF) 100 MCG/2ML IJ SOLN
INTRAMUSCULAR | Status: AC
  Administered 2019-12-03: 50 ug via INTRAVENOUS
  Filled 2019-12-03: qty 4

## 2019-12-03 MED ORDER — FENTANYL CITRATE (PF) 100 MCG/2ML IJ SOLN: 50.0000 ug | Freq: Once | INTRAMUSCULAR | Status: AC

## 2019-12-03 MED ORDER — GABAPENTIN 300 MG PO CAPS
ORAL_CAPSULE | ORAL | Status: AC
  Administered 2019-12-03: 12:00:00 300 mg via ORAL
  Filled 2019-12-03: qty 1

## 2019-12-03 MED ORDER — ACETAMINOPHEN 500 MG PO TABS: 1000.0000 mg | ORAL_TABLET | ORAL | Status: AC

## 2019-12-03 MED ORDER — BUPIVACAINE LIPOSOME 1.3 % IJ SUSP: 20.0000 mL | Freq: Once | INTRAMUSCULAR | Status: DC

## 2019-12-03 MED ORDER — MIDAZOLAM HCL 2 MG/2ML IJ SOLN
INTRAMUSCULAR | Status: AC
  Administered 2019-12-03: 2 mg via INTRAVENOUS
  Filled 2019-12-03: qty 4

## 2019-12-03 MED ORDER — MIDAZOLAM HCL 2 MG/2ML IJ SOLN: 2.0000 mg | Freq: Once | INTRAMUSCULAR | Status: AC

## 2019-12-03 SURGICAL SUPPLY — 27 items
BLADE SURG 15 STRL LF DISP TIS (BLADE) ×1 IMPLANT
BLADE SURG 15 STRL SS (BLADE) ×2
CANISTER SUCT 3000ML PPV (MISCELLANEOUS) ×3 IMPLANT
COVER WAND RF STERILE (DRAPES) ×3 IMPLANT
DRAIN PENROSE 1/4X12 LTX STRL (WOUND CARE) IMPLANT
DRAIN PENROSE 5/8X18 LTX STRL (DRAIN) IMPLANT
DRAPE LAPAROTOMY 77X122 PED (DRAPES) ×3 IMPLANT
ELECT CAUTERY BLADE 6.4 (BLADE) ×3 IMPLANT
ELECT REM PT RETURN 9FT ADLT (ELECTROSURGICAL) ×3
ELECTRODE REM PT RTRN 9FT ADLT (ELECTROSURGICAL) ×1 IMPLANT
GAUZE SPONGE 4X4 12PLY STRL (GAUZE/BANDAGES/DRESSINGS) IMPLANT
GLOVE ORTHO TXT STRL SZ7.5 (GLOVE) ×3 IMPLANT
GOWN STRL REUS W/ TWL LRG LVL3 (GOWN DISPOSABLE) ×2 IMPLANT
GOWN STRL REUS W/TWL LRG LVL3 (GOWN DISPOSABLE) ×4
KIT TURNOVER KIT A (KITS) ×3 IMPLANT
NEEDLE HYPO 22GX1.5 SAFETY (NEEDLE) ×3 IMPLANT
NS IRRIG 1000ML POUR BTL (IV SOLUTION) ×3 IMPLANT
PACK BASIN MINOR ARMC (MISCELLANEOUS) ×3 IMPLANT
PAD ABD DERMACEA PRESS 5X9 (GAUZE/BANDAGES/DRESSINGS) IMPLANT
SOL PREP PVP 2OZ (MISCELLANEOUS) ×3
SOLUTION PREP PVP 2OZ (MISCELLANEOUS) ×1 IMPLANT
SPONGE LAP 18X18 RF (DISPOSABLE) ×3 IMPLANT
SUT ETHILON 3-0 FS-10 30 BLK (SUTURE)
SUTURE EHLN 3-0 FS-10 30 BLK (SUTURE) IMPLANT
SWAB CULTURE AMIES ANAERIB BLU (MISCELLANEOUS) ×3 IMPLANT
SYR 10ML LL (SYRINGE) ×3 IMPLANT
SYR BULB IRRIG 60ML STRL (SYRINGE) ×3 IMPLANT

## 2019-12-03 NOTE — Progress Notes (Signed)
Patient unable to urinate for UDS. Given fluids via IV. Continuing to wait for him to be able to void.

## 2019-12-03 NOTE — Progress Notes (Signed)
Patient bladder scanned after getting of IVF, and unable to void.   Patient voided about 36mL.   Bladder scan showed .

## 2019-12-03 NOTE — Interval H&P Note (Signed)
History and Physical Interval Note:  12/03/2019 12:42 PM  Randall Shelton  has presented today for surgery, with the diagnosis of Pararectal abscess.  The various methods of treatment have been discussed with the patient and family. After consideration of risks, benefits and other options for treatment, the patient has consented to  Procedure(s): IRRIGATION AND DEBRIDEMENT PERIRECTAL ABSCESS (Left) as a surgical intervention.  The patient's history has been reviewed, patient examined, no change in status, stable for surgery.  I have reviewed the patient's chart and labs.  Questions were answered to the patient's satisfaction.     Campbell Lerner

## 2019-12-03 NOTE — Progress Notes (Signed)
Dr Claudine Mouton at bedside, speaking with patient. Discussing options.

## 2019-12-03 NOTE — Discharge Instructions (Signed)

## 2019-12-03 NOTE — Op Note (Signed)
Incision and drainage of perianal abscess.  Pre-operative Diagnosis: Perianal abscess.  Post-operative Diagnosis: same.    Surgeon: Campbell Lerner, M.D., FACS  Anesthesia: Moderate sedation with a total of 4 mg Versed and total of 100 mcg of fentanyl.   Findings:  As expected, fluctuant mass of left side, significant drainage obtained.  Estimated Blood Loss: Less than 5 mL          Specimens: No specimens collected          Complications: none              Procedure Details  The patient was seen again in the Holding Room. The benefits, complications, treatment options, and expected outcomes were discussed with the patient. The risks of bleeding, infection, recurrence of symptoms, failure to resolve symptoms, unanticipated injury, infection, any of which could require further surgery were reviewed with the patient. The likelihood of improving the patient's symptoms with return to their baseline status is good.  The patient and/or family concurred with the proposed plan, giving informed consent.  The patient was positioned in preoperative holding, identified and the procedure verified.    After the induction, the patient was positioned in the lateral decubitus position and the area right was prepped with iodine.   Once I felt the edge adequately taken off utilizing moderate sedation with close monitoring and observation.  Made a stab incision with 11 blade on the left side of the perianal region central to fluctuant distended abscess cavity.  Obtained immediate output significant amount of purulent drainage.  Probed the wound to ensure adequate loculations were lysed.  Findings nondisplaced quarter inch Penrose and secured with a 2-0 nylon at the skin.  Dry dressings and ABD pads were applied once it felt that he was no longer actively draining purulent material.  He tolerated the procedure well.        Campbell Lerner M.D., [facility] 12/03/2019 5:26 PM

## 2019-12-03 NOTE — Anesthesia Preprocedure Evaluation (Deleted)
Anesthesia Evaluation  Patient identified by MRN, date of birth, ID band Patient awake    Reviewed: Allergy & Precautions, NPO status , Patient's Chart, lab work & pertinent test results  Airway Mallampati: II  TM Distance: >3 FB     Dental  (+) Chipped   Pulmonary asthma , Current Smoker,    Pulmonary exam normal        Cardiovascular Normal cardiovascular exam+ dysrhythmias      Neuro/Psych negative neurological ROS  negative psych ROS   GI/Hepatic (+)     substance abuse  cocaine use and marijuana use,   Endo/Other  negative endocrine ROS  Renal/GU negative Renal ROS  negative genitourinary   Musculoskeletal  (+) Arthritis , Osteoarthritis,    Abdominal Normal abdominal exam  (+)   Peds negative pediatric ROS (+)  Hematology negative hematology ROS (+)   Anesthesia Other Findings Past Medical History: No date: Asthma No date: Chronic back pain No date: Irregular heart beat No date: Pancreatitis No date: Proctocolitis  Reproductive/Obstetrics                             Anesthesia Physical Anesthesia Plan  ASA: II  Anesthesia Plan: General   Post-op Pain Management:    Induction: Intravenous  PONV Risk Score and Plan:   Airway Management Planned: Oral ETT  Additional Equipment:   Intra-op Plan:   Post-operative Plan: Extubation in OR  Informed Consent: I have reviewed the patients History and Physical, chart, labs and discussed the procedure including the risks, benefits and alternatives for the proposed anesthesia with the patient or authorized representative who has indicated his/her understanding and acceptance.     Dental advisory given  Plan Discussed with: CRNA and Surgeon  Anesthesia Plan Comments:         Anesthesia Quick Evaluation

## 2019-12-03 NOTE — Progress Notes (Signed)
Patients UDS positive for Cocaine. Spoke with anesthesia. Dr Toula Moos notified. Awaiting further orders/instructions

## 2019-12-06 ENCOUNTER — Telehealth: Payer: Self-pay | Admitting: Surgery

## 2019-12-06 NOTE — Telephone Encounter (Signed)
Patient has questions regarding work note since he has a penrose drain present and he climbs stairs and does a lot of bending and reaching.  Patient to check with employer  and call back if he decides to move forward with out of work note until 12/14/19. Patient has follow up scheduled 12/14/19.

## 2019-12-06 NOTE — Telephone Encounter (Signed)
Patient is calling and has some questions about his drain and is asking if one of the nurses could give him a call. Please call patient and advise.

## 2019-12-14 ENCOUNTER — Other Ambulatory Visit: Payer: Self-pay

## 2019-12-14 ENCOUNTER — Encounter: Payer: Self-pay | Admitting: Surgery

## 2019-12-14 ENCOUNTER — Ambulatory Visit (INDEPENDENT_AMBULATORY_CARE_PROVIDER_SITE_OTHER): Payer: Self-pay | Admitting: Surgery

## 2019-12-14 VITALS — BP 101/67 | HR 80 | Temp 97.5°F | Resp 14 | Ht 73.0 in | Wt 190.4 lb

## 2019-12-14 DIAGNOSIS — K611 Rectal abscess: Secondary | ICD-10-CM

## 2019-12-14 NOTE — Progress Notes (Signed)
[facility] POST-OP OFFICE VISIT  12/14/2019  HPI: Randall Shelton is a 39 y.o. male 11 days s/p I&D perianal abscess.  No complaints, denies diminishment of drainage.  Denies any pain with bowel movements.  Denies constipation.  Reports his completion of his oral antibiotic.  Vital signs: BP 101/67   Pulse 80   Temp (!) 97.5 F (36.4 C)   Resp 14   Ht 6\' 1"  (1.854 m)   Wt 190 lb 6.4 oz (86.4 kg)   SpO2 95%   BMI 25.12 kg/m    Physical Exam: Constitutional: Appears well. Abdomen: Soft nontender. Skin: Perianal skin incision appears to have healed.  Penrose drain is currently all external.  The suture is divided in the drain and discarded.  Assessment/Plan: This is a 39 y.o. male 11 days s/p perianal abscess incision and drainage.  Patient Active Problem List   Diagnosis Date Noted  . Perirectal abscess 12/02/2019  . Chronic back pain   . Irregular heart beat   . Proctocolitis   . Pancreatitis   . Asthma     -We discussed risks of recurrence, would like the opportunity to treat him aside from the emergency department.  I believe he understands our availability and the lack of need for routine follow-up.  We will follow-up as needed.   02/01/2020 M.D., FACS 12/14/2019, 10:50 AM

## 2019-12-14 NOTE — Patient Instructions (Addendum)
Your penrose drain was removed today.  Follow up as needed. Call the office if you have any questions or concerns.

## 2020-02-17 ENCOUNTER — Emergency Department
Admission: EM | Admit: 2020-02-17 | Discharge: 2020-02-17 | Disposition: A | Discharge status: Home / Self Care | Payer: Self-pay | Attending: Emergency Medicine | Admitting: Emergency Medicine

## 2020-02-17 ENCOUNTER — Other Ambulatory Visit: Payer: Self-pay

## 2020-02-17 DIAGNOSIS — Z79899 Other long term (current) drug therapy: Secondary | ICD-10-CM | POA: Insufficient documentation

## 2020-02-17 DIAGNOSIS — F17210 Nicotine dependence, cigarettes, uncomplicated: Secondary | ICD-10-CM | POA: Insufficient documentation

## 2020-02-17 DIAGNOSIS — L0501 Pilonidal cyst with abscess: Secondary | ICD-10-CM

## 2020-02-17 MED ORDER — SULFAMETHOXAZOLE-TRIMETHOPRIM 800-160 MG PO TABS
1.0000 | ORAL_TABLET | Freq: Once | ORAL | Status: AC
  Administered 2020-02-17: 1 via ORAL
  Filled 2020-02-17: qty 1

## 2020-02-17 MED ORDER — OXYCODONE-ACETAMINOPHEN 5-325 MG PO TABS
1.0000 | ORAL_TABLET | Freq: Once | ORAL | Status: AC
  Administered 2020-02-17: 1 via ORAL
  Filled 2020-02-17: qty 1

## 2020-02-17 MED ORDER — SULFAMETHOXAZOLE-TRIMETHOPRIM 800-160 MG PO TABS: 1.0000 | ORAL_TABLET | Freq: Two times a day (BID) | ORAL | 0 refills | Status: DC

## 2020-02-17 MED ORDER — OXYCODONE-ACETAMINOPHEN 7.5-325 MG PO TABS: 1.0000 | ORAL_TABLET | Freq: Four times a day (QID) | ORAL | 0 refills | Status: DC | PRN

## 2020-02-17 NOTE — Discharge Instructions (Addendum)
Follow discharge care instruction take medication as directed.  Follow-up with the surgical clinic listed on your discharge care instructions to schedule appointment for further evaluation and treatment.

## 2020-02-17 NOTE — ED Provider Notes (Signed)
[facility] Emergency Department Provider Note   ____________________________________________   First MD Initiated Contact with Patient 02/17/20 1106     (approximate)  I have reviewed the triage vital signs and the nursing notes.   HISTORY  Chief Complaint Abscess    HPI OMEED Shelton is a 39 y.o. male patient presents with abscess to the left buttocks.  Patient has a history of recurrent pilonidal abscess.  Patient was seen 2 months ago at this facility and incision and drainage was done by the surgeon on-call.  Patient was told to follow-up with surgical clinic but elected come back to the emergency room.  Patient denies no drainage at this time.  Patient rates his pain as a 10/10.  Patient described pain as "achy".  No palliative measure for complaint.         Past Medical History:  Diagnosis Date  . Asthma   . Chronic back pain   . Irregular heart beat   . Pancreatitis   . Proctocolitis     Patient Active Problem List   Diagnosis Date Noted  . Perirectal abscess 12/02/2019  . Chronic back pain   . Irregular heart beat   . Proctocolitis   . Pancreatitis   . Asthma     Past Surgical History:  Procedure Laterality Date  . WISDOM TOOTH EXTRACTION Bilateral 2002    Prior to Admission medications   Medication Sig Start Date End Date Taking? Authorizing Provider  albuterol (PROVENTIL HFA;VENTOLIN HFA) 108 (90 Base) MCG/ACT inhaler Inhale 2 puffs into the lungs every 6 (six) hours as needed for wheezing or shortness of breath. 06/06/17   Merrily Brittle, MD  oxyCODONE-acetaminophen (PERCOCET) 7.5-325 MG tablet Take 1 tablet by mouth every 6 (six) hours as needed. 02/17/20   Joni Reining, PA-C  sulfamethoxazole-trimethoprim (BACTRIM DS) 800-160 MG tablet Take 1 tablet by mouth 2 (two) times daily. 02/17/20   Joni Reining, PA-C    Allergies Patient has no known allergies.  Family History  Problem Relation Age of Onset  . COPD Mother          Smoker  . Hiatal hernia Mother   . Heart disease Father   . Hypertension Father   . Diverticulitis Father   . Bone cancer Father   . Heart disease Sister   . Seizures Sister   . Healthy Sister     Social History Social History   Tobacco Use  . Smoking status: Current Every Day Smoker    Packs/day: 1.50    Types: Cigarettes  . Smokeless tobacco: Never Used  Substance Use Topics  . Alcohol use: Yes    Comment: Occasional  . Drug use: Yes    Types: Marijuana    Review of Systems Constitutional: No fever/chills Eyes: No visual changes. ENT: No sore throat. Cardiovascular: Denies chest pain. Respiratory: Denies shortness of breath. Gastrointestinal: No abdominal pain.  No nausea, no vomiting.  No diarrhea.  No constipation. Genitourinary: Negative for dysuria. Musculoskeletal: Chronic back pain. Skin: Abscess left buttocks. Neurological: Negative for headaches, focal weakness or numbness.   ____________________________________________   PHYSICAL EXAM:  VITAL SIGNS: ED Triage Vitals  Enc Vitals Group     BP 02/17/20 1100 131/67     Pulse Rate 02/17/20 1100 83     Resp 02/17/20 1100 18     Temp 02/17/20 1100 98.7 F (37.1 C)     Temp Source 02/17/20 1100 Oral     SpO2 02/17/20 1100  100 %     Weight 02/17/20 1101 190 lb (86.2 kg)     Height 02/17/20 1101 6\' 1"  (1.854 m)     Head Circumference --      Peak Flow --      Pain Score 02/17/20 1100 10     Pain Loc --      Pain Edu? --      Excl. in [place]? --    Constitutional: Alert and oriented. Well appearing and in no acute distress. Cardiovascular: Normal rate, regular rhythm. Grossly normal heart sounds.  Good peripheral circulation. Respiratory: Normal respiratory effort.  No retractions. Lungs CTAB. Skin: Patient has erythema radiating left buttocks to the proximal rectum.  Mild guarding palpation.  Area is nonfluctuant this time. Psychiatric: Mood and affect are normal. Speech and behavior are  normal.  ____________________________________________   LABS (all labs ordered are listed, but only abnormal results are displayed)  Labs Reviewed - No data to display ____________________________________________  EKG   ____________________________________________  RADIOLOGY  ED MD interpretation:    Official radiology report(s): No results found.  ____________________________________________   PROCEDURES  Procedure(s) performed (including Critical Care):  Procedures   ____________________________________________   INITIAL IMPRESSION / ASSESSMENT AND PLAN / ED COURSE  As part of my medical decision making, I reviewed the following data within the [facility]     Patient presents with left buttocks pain with a history of pilonidal cyst.  Patient has a developing area on the left buttocks.  Discussed rationale for not incised and drained at this time.  Patient given discharge care instruction and prescription for Bactrim DS and Percocets.  Patient advised to follow-up with the surgical clinic as directed from previous visit.  Patient given a work note.    Randall Shelton was evaluated in Emergency Department on 02/17/2020 for the symptoms described in the history of present illness. He was evaluated in the context of the global COVID-19 pandemic, which necessitated consideration that the patient might be at risk for infection with the SARS-CoV-2 virus that causes COVID-19. Institutional protocols and algorithms that pertain to the evaluation of patients at risk for COVID-19 are in a state of rapid change based on information released by regulatory bodies including the CDC and federal and state organizations. These policies and algorithms were followed during the patient's care in the ED.       ____________________________________________   FINAL CLINICAL IMPRESSION(S) / ED DIAGNOSES  Final diagnoses:  Pilonidal abscess     ED Discharge Orders          Ordered    sulfamethoxazole-trimethoprim (BACTRIM DS) 800-160 MG tablet  2 times daily     02/17/20 1118    oxyCODONE-acetaminophen (PERCOCET) 7.5-325 MG tablet  Every 6 hours PRN     02/17/20 1118           Note:  This document was prepared using Dragon voice recognition software and may include unintentional dictation errors.    Sable Feil, PA-C 02/17/20 1126    Duffy Bruce, MD 02/17/20 2033

## 2020-02-17 NOTE — ED Triage Notes (Signed)
Pt has abscess on left buttock. Pt has had one there about a month ago as well. Highest temp at home 99.6. Has tried sitz baths.

## 2020-02-22 ENCOUNTER — Ambulatory Visit (INDEPENDENT_AMBULATORY_CARE_PROVIDER_SITE_OTHER): Payer: Self-pay | Admitting: Surgery

## 2020-02-22 ENCOUNTER — Encounter: Payer: Self-pay | Admitting: Surgery

## 2020-02-22 ENCOUNTER — Other Ambulatory Visit: Payer: Self-pay

## 2020-02-22 VITALS — BP 112/71 | HR 80 | Temp 98.6°F | Ht 73.0 in | Wt 184.0 lb

## 2020-02-22 DIAGNOSIS — K611 Rectal abscess: Secondary | ICD-10-CM

## 2020-02-22 MED ORDER — SULFAMETHOXAZOLE-TRIMETHOPRIM 800-160 MG PO TABS: 1.0000 | ORAL_TABLET | Freq: Two times a day (BID) | ORAL | 0 refills | Status: AC

## 2020-02-22 NOTE — Progress Notes (Signed)
Patient ID: Randall Shelton, male   DOB: 20-Apr-1981, 39 y.o.   MRN: [id number]  Chief Complaint: Perianal/perirectal abscess, recurrent.  History of Present Illness Randall Shelton is a 39 y.o. male with recurrent abscess as above.  I last I&D this patient first week of March.  He had an uneventful recovery, and was cautioned this could recur and it did.  Seen in the ED, no labs imaging or procedures completed.  Given oral antibiotics and pain medication and told to follow-up here.  He reports a low-grade fever, has been utilizing warm soaks.  He began draining about 4-5 days ago, and admits to squeezing the area to help with its drainage.  The drainage is currently nonpurulent and more of a serous fluid.  He continues to have a tender, hard and swollen area.  Past Medical History Past Medical History:  Diagnosis Date  . Asthma   . Chronic back pain   . Irregular heart beat   . Pancreatitis   . Proctocolitis       Past Surgical History:  Procedure Laterality Date  . WISDOM TOOTH EXTRACTION Bilateral 2002    No Known Allergies  Current Outpatient Medications  Medication Sig Dispense Refill  . albuterol (PROVENTIL HFA;VENTOLIN HFA) 108 (90 Base) MCG/ACT inhaler Inhale 2 puffs into the lungs every 6 (six) hours as needed for wheezing or shortness of breath. 1 Inhaler 0  . oxyCODONE-acetaminophen (PERCOCET) 7.5-325 MG tablet Take 1 tablet by mouth every 6 (six) hours as needed. 20 tablet 0  . sulfamethoxazole-trimethoprim (BACTRIM DS) 800-160 MG tablet Take 1 tablet by mouth 2 (two) times daily. 20 tablet 0  . sulfamethoxazole-trimethoprim (BACTRIM DS) 800-160 MG tablet Take 1 tablet by mouth 2 (two) times daily for 10 days. Continue antibiotics for 10 more days 20 tablet 0   No current facility-administered medications for this visit.    Family History Family History  Problem Relation Age of Onset  . COPD Mother        Smoker  . Hiatal hernia Mother   . Heart disease Father   .  Hypertension Father   . Diverticulitis Father   . Bone cancer Father   . Heart disease Sister   . Seizures Sister   . Healthy Sister       Social History Social History   Tobacco Use  . Smoking status: Current Every Day Smoker    Packs/day: 1.50    Types: Cigarettes  . Smokeless tobacco: Never Used  Substance Use Topics  . Alcohol use: Yes    Comment: Occasional  . Drug use: Yes    Types: Marijuana        Review of Systems  Constitutional: Positive for fever. Negative for chills.  HENT: Negative.   Eyes: Negative.   Respiratory: Negative.   Cardiovascular: Negative.   Gastrointestinal: Negative for abdominal pain, blood in stool, constipation, diarrhea and melena.  Genitourinary: Negative.   Skin: Negative.   Neurological: Negative.   Endo/Heme/Allergies: Negative.   All other systems reviewed and are negative.     Physical Exam Blood pressure 112/71, pulse 80, temperature 98.6 F (37 C), height 6\' 1"  (1.854 m), weight 184 lb (83.5 kg), SpO2 95 %. Last Weight  Most recent update: 02/22/2020  9:22 AM   Weight  83.5 kg (184 lb)            CONSTITUTIONAL: Well developed, and nourished, appropriately responsive and aware without distress.   EYES: Sclera non-icteric.   EARS, NOSE,  MOUTH AND THROAT: Mask worn.   Hearing is intact to voice.  NECK: Trachea is midline, and there is no jugular venous distension.  LYMPH NODES:  Lymph nodes in the neck are not enlarged. RESPIRATORY:  Lungs are clear, and breath sounds are equal bilaterally. Normal respiratory effort without pathologic use of accessory muscles. CARDIOVASCULAR: Heart is regular in rate and rhythm. GI: The abdomen is  soft, nontender, and nondistended.  GU: Left sided focus of drainage, with markedly indurated surrounding region approximately 3 cm diameter.  With a sterile Q-tip I was able to probe the opening to a depth of 3 cm in approximately 2 separate directions, neither of them superficial.  This was  well-tolerated, did not elicit any further drainage, nor did it require any specific push against resistance.  I believe the patient can continue to probe his perianal sinus tract over the next few weeks. MUSCULOSKELETAL:  Symmetrical muscle tone appreciated in all four extremities.    SKIN: Skin turgor is normal. No pathologic skin lesions appreciated.  NEUROLOGIC:  Motor and sensation appear grossly normal.  Cranial nerves are grossly without defect. PSYCH:  Alert and oriented to person, place and time. Affect is appropriate for situation.  Data Reviewed I have personally reviewed what is currently available of the patient's imaging, recent labs and medical records.   Labs:  CBC Latest Ref Rng & Units 12/02/2019 11/09/2018 06/05/2017  WBC 4.0 - 10.5 K/uL 14.8(H) 10.8(H) 15.2(H)  Hemoglobin 13.0 - 17.0 g/dL 23.7 62.8 31.5  Hematocrit 39.0 - 52.0 % 39.5 43.0 40.0  Platelets 150 - 400 K/uL 216 251 236   CMP Latest Ref Rng & Units 12/02/2019 11/09/2018 06/05/2017  Glucose 70 - 99 mg/dL 176(H) 607(P) 710(G)  BUN 6 - 20 mg/dL 13 15 14   Creatinine 0.61 - 1.24 mg/dL 2.69 4.85)  Sodium 135 - 145 mmol/L 136 137 140  Potassium 3.5 - 5.1 mmol/L 3.6 3.4(L) 3.9  Chloride 98 - 111 mmol/L 106 107 106  CO2 22 - 32 mmol/L 23 22 27   Calcium 8.9 - 10.3 mg/dL 4.62(V) ) 0.3(J)  Total Protein 6.5 - 8.1 g/dL - - 6.9  Total Bilirubin 0.3 - 1.2 mg/dL - - 0.0(X)  Alkaline Phos 38 - 126 U/L - - 83  AST 15 - 41 U/L - - 22  ALT 17 - 63 U/L - - 21      Imaging:  Within last 24 hrs: No results found.  Assessment    Recurrent perianal/perirectal abscess.  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 Patient Active Problem List   Diagnosis Date Noted  . Perirectal abscess 12/02/2019  . Chronic back pain   . Irregular heart beat   . Proctocolitis   . Pancreatitis   . Asthma     Plan    We will extend his oral antibiotic for a total of 3 weeks, advised him to probe his sinus tract  twice daily until our next visit in 3 weeks.  Discussed our goals, expectant and hopeful that the inflammation will diminish with continued antibiotic use and continued encouragement of further sinus tract drainage.  We may eventually require some form of exploration to determine the degree of fistula in anal, additional evaluation by MRI etc. Follow-up in 3 weeks, I believe the patient understands his instructions.  Face-to-face time spent with the patient and accompanying care providers(if present) was 35 minutes, with more than 50% of the time spent counseling, educating, and coordinating  care of the patient.      Ronny Bacon M.D., FACS 02/22/2020, 9:44 AM

## 2020-02-22 NOTE — Patient Instructions (Addendum)
Continue to do warm soaks several times a day. Continue to try and keep the drain open with a Q-tip 1-2 times a day.  We will extend out your antibiotic course for an additional 10 days, take all of your antibiotics. Continue to take Ibuprofen daily.   Follow up here in 3 weeks.

## 2020-03-14 ENCOUNTER — Encounter: Payer: Self-pay | Admitting: Physician Assistant

## 2020-03-14 ENCOUNTER — Other Ambulatory Visit: Payer: Self-pay

## 2020-03-14 ENCOUNTER — Ambulatory Visit (INDEPENDENT_AMBULATORY_CARE_PROVIDER_SITE_OTHER): Payer: Self-pay | Admitting: Physician Assistant

## 2020-03-14 VITALS — BP 112/66 | HR 60 | Temp 97.7°F | Ht 73.0 in | Wt 188.4 lb

## 2020-03-14 DIAGNOSIS — K611 Rectal abscess: Secondary | ICD-10-CM

## 2020-03-14 NOTE — Progress Notes (Signed)
[facility] NOTE  03/14/2020  History of Present Illness: Randall Shelton is a 39 y.o. male with a history of perianal abscess last I&D'd by Dr Christian Mate in March of this year. He presents today in follow up for the same. Last seen on 05/25 and was doing well. He has completed his course of bactrim. Today reports no fever, chills, nausea, or emesis. Occasionally pain at previous abscess site. Typically resolve spontaneously and does not require pain medications. No drainage. He has been using a q-tip to ensure proper wound healing. No new complaints.    Past Medical History: Past Medical History:  Diagnosis Date  . Asthma   . Chronic back pain   . Irregular heart beat   . Pancreatitis   . Proctocolitis      Past Surgical History: Past Surgical History:  Procedure Laterality Date  . WISDOM TOOTH EXTRACTION Bilateral 2002    Home Medications: Prior to Admission medications   Medication Sig Start Date End Date Taking? Authorizing Provider  albuterol (PROVENTIL HFA;VENTOLIN HFA) 108 (90 Base) MCG/ACT inhaler Inhale 2 puffs into the lungs every 6 (six) hours as needed for wheezing or shortness of breath. 06/06/17  Yes Darel Hong, MD  oxyCODONE-acetaminophen (PERCOCET) 7.5-325 MG tablet Take 1 tablet by mouth every 6 (six) hours as needed. Patient not taking: Reported on 03/14/2020 02/17/20   Sable Feil, PA-C  sulfamethoxazole-trimethoprim (BACTRIM DS) 800-160 MG tablet Take 1 tablet by mouth 2 (two) times daily. Patient not taking: Reported on 03/14/2020 02/17/20   Sable Feil, PA-C    Allergies: No Known Allergies  Review of Systems: Review of Systems  Constitutional: Negative for chills and fever.  Gastrointestinal: Negative for diarrhea, nausea and vomiting.  Skin:       + Perianal Abscess (healed)  All other systems reviewed and are negative.   Physical Exam BP 112/66   Pulse 60   Temp 97.7 F (36.5 C) (Temporal)   Ht 6\' 1"   (1.854 m)   Wt 188 lb 6.4 oz (85.5 kg)   SpO2 96%   BMI 24.86 kg/m   Physical Exam Vitals and nursing note reviewed. Exam conducted with a chaperone present.  Constitutional:      Appearance: Normal appearance. He is normal weight.  Eyes:     Conjunctiva/sclera: Conjunctivae normal.     Pupils: Pupils are equal, round, and reactive to light.  Pulmonary:     Effort: Pulmonary effort is normal. No respiratory distress.  Genitourinary:   Musculoskeletal:     Right lower leg: No edema.     Left lower leg: No edema.  Skin:    General: Skin is warm and dry.     Findings: No erythema.  Neurological:     General: No focal deficit present.     Mental Status: He is alert and oriented to person, place, and time.  Psychiatric:        Mood and Affect: Mood normal.        Behavior: Behavior normal.     Labs/Imaging: No new imaging studies pertinent to this visit  Assessment and Plan: This is a 39 y.o. male with history of perianal abscess    - Well healed at this time  - No need for any additional ABx  - No need for any intervention or I&D  - Tylenol/Ibuprofen for pain control  - Continue with good rectal hygiene, limit constipation  - Follow up prn  Face-to-face time spent with the  patient and care providers was 15 minutes, with more than 50% of the time spent counseling, educating, and coordinating care of the patient.     Lynden Oxford, PA-C [facility] 03/14/2020, 9:18 AM [phone] M-F: 7am - 4pm

## 2020-03-14 NOTE — Patient Instructions (Addendum)
Laqueta Due, PA discussed with patient the sharp pain is experiencing, may be from the scar tissue. Patient was advised to take Tylenol or Ibuprofen to help with pain management.  Follow-up with our office as needed.  Please call and ask to speak with a nurse if you develop questions or concerns.  Wound Care, Adult Taking care of your wound properly can help to prevent pain, infection, and scarring. It can also help your wound to heal more quickly. How to care for your wound Wound care      Follow instructions from your health care provider about how to take care of your wound. Make sure you: ? Wash your hands with soap and water before you change the bandage (dressing). If soap and water are not available, use hand sanitizer. ? Change your dressing as told by your health care provider. ? Leave stitches (sutures), skin glue, or adhesive strips in place. These skin closures may need to stay in place for 2 weeks or longer. If adhesive strip edges start to loosen and curl up, you may trim the loose edges. Do not remove adhesive strips completely unless your health care provider tells you to do that.  Check your wound area every day for signs of infection. Check for: ? Redness, swelling, or pain. ? Fluid or blood. ? Warmth. ? Pus or a bad smell.  Ask your health care provider if you should clean the wound with mild soap and water. Doing this may include: ? Using a clean towel to pat the wound dry after cleaning it. Do not rub or scrub the wound. ? Applying a cream or ointment. Do this only as told by your health care provider. ? Covering the incision with a clean dressing.  Ask your health care provider when you can leave the wound uncovered.  Keep the dressing dry until your health care provider says it can be removed. Do not take baths, swim, use a hot tub, or do anything that would put the wound underwater until your health care provider approves. Ask your health care provider if you  can take showers. You may only be allowed to take sponge baths. Medicines   If you were prescribed an antibiotic medicine, cream, or ointment, take or use the antibiotic as told by your health care provider. Do not stop taking or using the antibiotic even if your condition improves.  Take over-the-counter and prescription medicines only as told by your health care provider. If you were prescribed pain medicine, take it 30 or more minutes before you do any wound care or as told by your health care provider. General instructions  Return to your normal activities as told by your health care provider. Ask your health care provider what activities are safe.  Do not scratch or pick at the wound.  Do not use any products that contain nicotine or tobacco, such as cigarettes and e-cigarettes. These may delay wound healing. If you need help quitting, ask your health care provider.  Keep all follow-up visits as told by your health care provider. This is important.  Eat a diet that includes protein, vitamin A, vitamin C, and other nutrient-rich foods to help the wound heal. ? Foods rich in protein include meat, dairy, beans, nuts, and other sources. ? Foods rich in vitamin A include carrots and dark green, leafy vegetables. ? Foods rich in vitamin C include citrus, tomatoes, and other fruits and vegetables. ? Nutrient-rich foods have protein, carbohydrates, fat, vitamins, or minerals. Eat a  variety of healthy foods including vegetables, fruits, and whole grains. Contact a health care provider if:  You received a tetanus shot and you have swelling, severe pain, redness, or bleeding at the injection site.  Your pain is not controlled with medicine.  You have redness, swelling, or pain around the wound.  You have fluid or blood coming from the wound.  Your wound feels warm to the touch.  You have pus or a bad smell coming from the wound.  You have a fever or chills.  You are nauseous or you  vomit.  You are dizzy. Get help right away if:  You have a red streak going away from your wound.  The edges of the wound open up and separate.  Your wound is bleeding, and the bleeding does not stop with gentle pressure.  You have a rash.  You faint.  You have trouble breathing. Summary  Always wash your hands with soap and water before changing your bandage (dressing).  To help with healing, eat foods that are rich in protein, vitamin A, vitamin C, and other nutrients.  Check your wound every day for signs of infection. Contact your health care provider if you suspect that your wound is infected. This information is not intended to replace advice given to you by your health care provider. Make sure you discuss any questions you have with your health care provider. Document Revised: 01/04/2019 Document Reviewed: 04/02/2016 Elsevier Patient Education  [place].

## 2020-08-17 ENCOUNTER — Telehealth: Payer: Self-pay | Admitting: Surgery

## 2020-08-17 ENCOUNTER — Ambulatory Visit: Payer: Self-pay | Admitting: Surgery

## 2020-08-17 ENCOUNTER — Other Ambulatory Visit: Payer: Self-pay

## 2020-08-17 ENCOUNTER — Encounter: Payer: Self-pay | Admitting: Surgery

## 2020-08-17 ENCOUNTER — Ambulatory Visit (INDEPENDENT_AMBULATORY_CARE_PROVIDER_SITE_OTHER): Payer: Self-pay | Admitting: Surgery

## 2020-08-17 ENCOUNTER — Other Ambulatory Visit
Admission: RE | Admit: 2020-08-17 | Discharge: 2020-08-17 | Disposition: A | Discharge status: Home / Self Care | Payer: Self-pay | Source: Ambulatory Visit | Attending: Surgery | Admitting: Surgery

## 2020-08-17 VITALS — BP 114/83 | HR 103 | Temp 98.5°F | Ht 73.0 in | Wt 193.4 lb

## 2020-08-17 DIAGNOSIS — Z01812 Encounter for preprocedural laboratory examination: Secondary | ICD-10-CM | POA: Insufficient documentation

## 2020-08-17 DIAGNOSIS — Z20822 Contact with and (suspected) exposure to covid-19: Secondary | ICD-10-CM | POA: Insufficient documentation

## 2020-08-17 DIAGNOSIS — K611 Rectal abscess: Secondary | ICD-10-CM

## 2020-08-17 DIAGNOSIS — K8681 Exocrine pancreatic insufficiency: Secondary | ICD-10-CM | POA: Insufficient documentation

## 2020-08-17 MED ORDER — GABAPENTIN 300 MG PO CAPS: 300.0000 mg | ORAL_CAPSULE | ORAL | Status: AC

## 2020-08-17 MED ORDER — LACTATED RINGERS IV SOLN: INTRAVENOUS | Status: DC

## 2020-08-17 MED ORDER — SODIUM CHLORIDE 0.9 % IV SOLN
2.0000 g | INTRAVENOUS | Status: AC
  Administered 2020-08-18: 2 g via INTRAVENOUS

## 2020-08-17 MED ORDER — CELECOXIB 200 MG PO CAPS: 200.0000 mg | ORAL_CAPSULE | ORAL | Status: AC

## 2020-08-17 MED ORDER — ORAL CARE MOUTH RINSE: 15.0000 mL | Freq: Once | OROMUCOSAL | Status: AC

## 2020-08-17 MED ORDER — AMOXICILLIN-POT CLAVULANATE 875-125 MG PO TABS: 1.0000 | ORAL_TABLET | Freq: Two times a day (BID) | ORAL | 0 refills | Status: AC

## 2020-08-17 MED ORDER — BUPIVACAINE LIPOSOME 1.3 % IJ SUSP: 20.0000 mL | Freq: Once | INTRAMUSCULAR | Status: DC

## 2020-08-17 MED ORDER — CHLORHEXIDINE GLUCONATE 0.12 % MT SOLN: 15.0000 mL | Freq: Once | OROMUCOSAL | Status: AC

## 2020-08-17 MED ORDER — ACETAMINOPHEN 500 MG PO TABS: 1000.0000 mg | ORAL_TABLET | ORAL | Status: AC

## 2020-08-17 NOTE — Patient Instructions (Addendum)
We will get you set up for surgery for tomorrow. You will need to have a Covid test today.   We have sent in a prescription for antibiotics.  No eating after midnight. You may have clear liquids up until 2 hours before your arrival time.  Take your antibiotics.

## 2020-08-17 NOTE — Progress Notes (Signed)
Patient ID: Randall Shelton, male   DOB: December 24, 1980, 39 y.o.   MRN: [id number]  Chief Complaint: Anal pain  History of Present Illness Randall Shelton is a 39 y.o. male with a tender hard spot which recurred in the same area of her prior I&D of a perianal/perirectal abscess.  This began 3+ days ago and has progressed in the interval.  Prior recurrence of spontaneously drains through the old scar/incision.  Unfortunately this time he has no drainage, has been utilizing warm soaks to help provoke drainage, and is utilized ibuprofen and Tylenol for pain control.  Concerned that his drug use may have contributed to this in the past he has reported to me that he has been clean.  He reports his bowel activity is pronounced primarily by his degree of pancreatic exocrine insufficiency.  He supposed to take a pancreatic enzyme, but cannot afford it.  Therefore his bowel movements are frequent, consistent with steatorrhea.  He denies any constipation.  He reports a low-grade fever.  But denies any chills.  Past Medical History Past Medical History:  Diagnosis Date  . Asthma   . Chronic back pain   . Irregular heart beat   . Pancreatitis   . Proctocolitis       Past Surgical History:  Procedure Laterality Date  . WISDOM TOOTH EXTRACTION Bilateral 2002    No Known Allergies  Current Outpatient Medications  Medication Sig Dispense Refill  . albuterol (PROVENTIL HFA;VENTOLIN HFA) 108 (90 Base) MCG/ACT inhaler Inhale 2 puffs into the lungs every 6 (six) hours as needed for wheezing or shortness of breath. 1 Inhaler 0  . amoxicillin-clavulanate (AUGMENTIN) 875-125 MG tablet Take 1 tablet by mouth 2 (two) times daily for 7 days. 14 tablet 0   No current facility-administered medications for this visit.    Family History Family History  Problem Relation Age of Onset  . COPD Mother        Smoker  . Hiatal hernia Mother   . Heart disease Father   . Hypertension Father   . Diverticulitis Father   . Bone  cancer Father   . Heart disease Sister   . Seizures Sister   . Healthy Sister       Social History Social History   Tobacco Use  . Smoking status: Current Every Day Smoker    Packs/day: 1.50    Types: Cigarettes  . Smokeless tobacco: Never Used  Vaping Use  . Vaping Use: Never used  Substance Use Topics  . Alcohol use: Yes    Comment: Occasional  . Drug use: Yes    Types: Marijuana        Review of Systems  Constitutional: Positive for fever. Negative for chills.  HENT: Negative.   Eyes: Negative.   Respiratory: Negative.   Cardiovascular: Negative.   Gastrointestinal: Negative for abdominal pain, blood in stool and constipation.  Genitourinary: Negative.   Skin: Negative.   Neurological: Negative.   Psychiatric/Behavioral: Negative.       Physical Exam Blood pressure 114/83, pulse (!) 103, temperature 98.5 F (36.9 C), height 6\' 1"  (1.854 m), weight 193 lb 6.4 oz (87.7 kg), SpO2 96 %. Last Weight  Most recent update: 08/17/2020  9:00 AM   Weight  87.7 kg (193 lb 6.4 oz)            CONSTITUTIONAL: Well developed, and nourished, appropriately responsive and aware without distress.   EYES: Sclera non-icteric.   EARS, NOSE, MOUTH AND THROAT: Mask worn.  Hearing is intact to voice.  NECK: Trachea is midline, and there is no jugular venous distension.  LYMPH NODES:  Lymph nodes in the neck are not enlarged. RESPIRATORY:  Lungs are clear, and breath sounds are equal bilaterally. Normal respiratory effort without pathologic use of accessory muscles. CARDIOVASCULAR: Heart is regular in rate and rhythm. GI: The abdomen is soft, nontender, and nondistended. There were no palpable masses. I did not appreciate hepatosplenomegaly. There were normal bowel sounds. GU: On the same left side of his perianal area the medial buttock is indurated and pink in color.  This seems to surround the area of his previous scar, there is no fluctuance, but marked tenderness at the scar  and perhaps slightly closer to his anus from the scar. MUSCULOSKELETAL:  Symmetrical muscle tone appreciated in all four extremities.    SKIN: Skin turgor is normal. No pathologic skin lesions appreciated.  NEUROLOGIC:  Motor and sensation appear grossly normal.  Cranial nerves are grossly without defect. PSYCH:  Alert and oriented to person, place and time. Affect is appropriate for situation.  Data Reviewed I have personally reviewed what is currently available of the patient's imaging, recent labs and medical records.   Labs:  CBC Latest Ref Rng & Units 12/02/2019 11/09/2018 06/05/2017  WBC 4.0 - 10.5 K/uL 14.8(H) 10.8(H) 15.2(H)  Hemoglobin 13.0 - 17.0 g/dL 13.9 14.3 14.0  Hematocrit 39 - 52 % 39.5 43.0 40.0  Platelets 150 - 400 K/uL 216 251 236   CMP Latest Ref Rng & Units 12/02/2019 11/09/2018 06/05/2017  Glucose 70 - 99 mg/dL 100(H) 111(H) 102(H)  BUN 6 - 20 mg/dL 13 15 14  Creatinine 0.61 - 1.24 mg/dL 0.95 1.03 1.38(H)  Sodium 135 - 145 mmol/L 136 137 140  Potassium 3.5 - 5.1 mmol/L 3.6 3.4(L) 3.9  Chloride 98 - 111 mmol/L 106 107 106  CO2 22 - 32 mmol/L 23 22 27  Calcium 8.9 - 10.3 mg/dL 8.5(L) 8.8(L) 8.7(L)  Total Protein 6.5 - 8.1 g/dL - - 6.9  Total Bilirubin 0.3 - 1.2 mg/dL - - 0.2(L)  Alkaline Phos 38 - 126 U/L - - 83  AST 15 - 41 U/L - - 22  ALT 17 - 63 U/L - - 21      Imaging:  Within last 24 hrs: No results found.  Assessment    Recurrent perirectal abscess. Patient Active Problem List   Diagnosis Date Noted  . Perirectal abscess 12/02/2019  . Chronic back pain   . Irregular heart beat   . Proctocolitis   . Pancreatitis   . Asthma     Plan    Incision and drainage.  Offered to complete in the office, patient quite reluctant and reasonably so not to pursue it with the ineffectiveness of local anesthetic. Scheduled him to present to the OR tomorrow, to have lab work obtained today including a drug screen to prevent any surprises for anesthesia. We discussed  part of the etiology why things are recurring on him, and there is been no history of fistula in the past.  Face-to-face time spent with the patient and accompanying care providers(if present) was 30 minutes, with more than 50% of the time spent counseling, educating, and coordinating care of the patient.      Kaeden Depaz M.D., FACS 08/17/2020, 9:24 AM     

## 2020-08-17 NOTE — Telephone Encounter (Signed)
Patient has been advised of Pre-Admission date/time, COVID Testing date and Surgery date.  Patient being worked in for surgery following day.  While in person he is informed of the following:   Surgery Date: 08/18/20 Preadmission Testing Date: 08/18/20 (arrive 2 hours early) Covid Testing Date: 08/17/20 - patient advised to go to [facility] ([address] [facility]) between 8a-1p  Patient has been made aware to call [phone], between 1-3:00pm the day before surgery, to find out what time to arrive for surgery.

## 2020-08-17 NOTE — H&P (View-Only) (Signed)
Patient ID: Randall Shelton, male   DOB: December 24, 1980, 39 y.o.   MRN: [id number]  Chief Complaint: Anal pain  History of Present Illness Randall Shelton is a 39 y.o. male with a tender hard spot which recurred in the same area of her prior I&D of a perianal/perirectal abscess.  This began 3+ days ago and has progressed in the interval.  Prior recurrence of spontaneously drains through the old scar/incision.  Unfortunately this time he has no drainage, has been utilizing warm soaks to help provoke drainage, and is utilized ibuprofen and Tylenol for pain control.  Concerned that his drug use may have contributed to this in the past he has reported to me that he has been clean.  He reports his bowel activity is pronounced primarily by his degree of pancreatic exocrine insufficiency.  He supposed to take a pancreatic enzyme, but cannot afford it.  Therefore his bowel movements are frequent, consistent with steatorrhea.  He denies any constipation.  He reports a low-grade fever.  But denies any chills.  Past Medical History Past Medical History:  Diagnosis Date  . Asthma   . Chronic back pain   . Irregular heart beat   . Pancreatitis   . Proctocolitis       Past Surgical History:  Procedure Laterality Date  . WISDOM TOOTH EXTRACTION Bilateral 2002    No Known Allergies  Current Outpatient Medications  Medication Sig Dispense Refill  . albuterol (PROVENTIL HFA;VENTOLIN HFA) 108 (90 Base) MCG/ACT inhaler Inhale 2 puffs into the lungs every 6 (six) hours as needed for wheezing or shortness of breath. 1 Inhaler 0  . amoxicillin-clavulanate (AUGMENTIN) 875-125 MG tablet Take 1 tablet by mouth 2 (two) times daily for 7 days. 14 tablet 0   No current facility-administered medications for this visit.    Family History Family History  Problem Relation Age of Onset  . COPD Mother        Smoker  . Hiatal hernia Mother   . Heart disease Father   . Hypertension Father   . Diverticulitis Father   . Bone  cancer Father   . Heart disease Sister   . Seizures Sister   . Healthy Sister       Social History Social History   Tobacco Use  . Smoking status: Current Every Day Smoker    Packs/day: 1.50    Types: Cigarettes  . Smokeless tobacco: Never Used  Vaping Use  . Vaping Use: Never used  Substance Use Topics  . Alcohol use: Yes    Comment: Occasional  . Drug use: Yes    Types: Marijuana        Review of Systems  Constitutional: Positive for fever. Negative for chills.  HENT: Negative.   Eyes: Negative.   Respiratory: Negative.   Cardiovascular: Negative.   Gastrointestinal: Negative for abdominal pain, blood in stool and constipation.  Genitourinary: Negative.   Skin: Negative.   Neurological: Negative.   Psychiatric/Behavioral: Negative.       Physical Exam Blood pressure 114/83, pulse (!) 103, temperature 98.5 F (36.9 C), height 6\' 1"  (1.854 m), weight 193 lb 6.4 oz (87.7 kg), SpO2 96 %. Last Weight  Most recent update: 08/17/2020  9:00 AM   Weight  87.7 kg (193 lb 6.4 oz)            CONSTITUTIONAL: Well developed, and nourished, appropriately responsive and aware without distress.   EYES: Sclera non-icteric.   EARS, NOSE, MOUTH AND THROAT: Mask worn.  Hearing is intact to voice.  NECK: Trachea is midline, and there is no jugular venous distension.  LYMPH NODES:  Lymph nodes in the neck are not enlarged. RESPIRATORY:  Lungs are clear, and breath sounds are equal bilaterally. Normal respiratory effort without pathologic use of accessory muscles. CARDIOVASCULAR: Heart is regular in rate and rhythm. GI: The abdomen is soft, nontender, and nondistended. There were no palpable masses. I did not appreciate hepatosplenomegaly. There were normal bowel sounds. GU: On the same left side of his perianal area the medial buttock is indurated and pink in color.  This seems to surround the area of his previous scar, there is no fluctuance, but marked tenderness at the scar  and perhaps slightly closer to his anus from the scar. MUSCULOSKELETAL:  Symmetrical muscle tone appreciated in all four extremities.    SKIN: Skin turgor is normal. No pathologic skin lesions appreciated.  NEUROLOGIC:  Motor and sensation appear grossly normal.  Cranial nerves are grossly without defect. PSYCH:  Alert and oriented to person, place and time. Affect is appropriate for situation.  Data Reviewed I have personally reviewed what is currently available of the patient's imaging, recent labs and medical records.   Labs:  CBC Latest Ref Rng & Units 12/02/2019 11/09/2018 06/05/2017  WBC 4.0 - 10.5 K/uL 14.8(H) 10.8(H) 15.2(H)  Hemoglobin 13.0 - 17.0 g/dL 70.2 63.7 85.8  Hematocrit 39 - 52 % 39.5 43.0 40.0  Platelets 150 - 400 K/uL 216 251 236   CMP Latest Ref Rng & Units 12/02/2019 11/09/2018 06/05/2017  Glucose 70 - 99 mg/dL 850(Y) 774(J) 287(O)  BUN 6 - 20 mg/dL 13 15 14   Creatinine 0.61 - 1.24 mg/dL 6.76 7.20)  Sodium 135 - 145 mmol/L 136 137 140  Potassium 3.5 - 5.1 mmol/L 3.6 3.4(L) 3.9  Chloride 98 - 111 mmol/L 106 107 106  CO2 22 - 32 mmol/L 23 22 27   Calcium 8.9 - 10.3 mg/dL 9.47(S) ) 9.6(G)  Total Protein 6.5 - 8.1 g/dL - - 6.9  Total Bilirubin 0.3 - 1.2 mg/dL - - 8.3(M)  Alkaline Phos 38 - 126 U/L - - 83  AST 15 - 41 U/L - - 22  ALT 17 - 63 U/L - - 21      Imaging:  Within last 24 hrs: No results found.  Assessment    Recurrent perirectal abscess. Patient Active Problem List   Diagnosis Date Noted  . Perirectal abscess 12/02/2019  . Chronic back pain   . Irregular heart beat   . Proctocolitis   . Pancreatitis   . Asthma     Plan    Incision and drainage.  Offered to complete in the office, patient quite reluctant and reasonably so not to pursue it with the ineffectiveness of local anesthetic. Scheduled him to present to the OR tomorrow, to have lab work obtained today including a drug screen to prevent any surprises for anesthesia. We discussed  part of the etiology why things are recurring on him, and there is been no history of fistula in the past.  Face-to-face time spent with the patient and accompanying care providers(if present) was 30 minutes, with more than 50% of the time spent counseling, educating, and coordinating care of the patient.      4.7(M M.D., FACS 08/17/2020, 9:24 AM

## 2020-08-18 ENCOUNTER — Encounter
Admission: RE | Disposition: A | Discharge status: Home / Self Care | Payer: Self-pay | Source: Ambulatory Visit | Attending: Surgery

## 2020-08-18 ENCOUNTER — Ambulatory Visit: Payer: Self-pay | Admitting: Anesthesiology

## 2020-08-18 ENCOUNTER — Other Ambulatory Visit: Payer: Self-pay

## 2020-08-18 ENCOUNTER — Ambulatory Visit
Admission: RE | Admit: 2020-08-18 | Discharge: 2020-08-18 | Disposition: A | Discharge status: Home / Self Care | Payer: Self-pay | Source: Ambulatory Visit | Attending: Surgery | Admitting: Surgery

## 2020-08-18 ENCOUNTER — Encounter: Payer: Self-pay | Admitting: Surgery

## 2020-08-18 DIAGNOSIS — F17210 Nicotine dependence, cigarettes, uncomplicated: Secondary | ICD-10-CM | POA: Insufficient documentation

## 2020-08-18 DIAGNOSIS — K621 Rectal polyp: Secondary | ICD-10-CM | POA: Insufficient documentation

## 2020-08-18 DIAGNOSIS — K610 Anal abscess: Secondary | ICD-10-CM

## 2020-08-18 DIAGNOSIS — K611 Rectal abscess: Secondary | ICD-10-CM | POA: Insufficient documentation

## 2020-08-18 HISTORY — PX: INCISION AND DRAINAGE ABSCESS: SHX5864

## 2020-08-18 LAB — COMPREHENSIVE METABOLIC PANEL
ALT: 10 U/L (ref 0–44)
AST: 12 U/L — ABNORMAL LOW (ref 15–41)
Albumin: 4 g/dL (ref 3.5–5.0)
Alkaline Phosphatase: 63 U/L (ref 38–126)
Anion gap: 11 (ref 5–15)
BUN: 14 mg/dL (ref 6–20)
CO2: 22 mmol/L (ref 22–32)
Calcium: 9.2 mg/dL (ref 8.9–10.3)
Chloride: 104 mmol/L (ref 98–111)
Creatinine, Ser: 0.97 mg/dL (ref 0.61–1.24)
GFR, Estimated: >60 mL/min (ref >60)
Glucose, Bld: 97 mg/dL (ref 70–99)
Potassium: 3.6 mmol/L (ref 3.5–5.1)
Sodium: 137 mmol/L (ref 135–145)
Total Bilirubin: 0.8 mg/dL (ref 0.3–1.2)
Total Protein: 7.6 g/dL (ref 6.5–8.1)

## 2020-08-18 LAB — URINE DRUG SCREEN, QUALITATIVE (ARMC ONLY)
Amphetamines, Ur Screen: NOT DETECTED
Barbiturates, Ur Screen: NOT DETECTED
Benzodiazepine, Ur Scrn: NOT DETECTED
Cannabinoid 50 Ng, Ur ~~LOC~~: POSITIVE — AB
Cocaine Metabolite,Ur ~~LOC~~: NOT DETECTED
MDMA (Ecstasy)Ur Screen: NOT DETECTED
Methadone Scn, Ur: NOT DETECTED
Opiate, Ur Screen: NOT DETECTED
Phencyclidine (PCP) Ur S: NOT DETECTED
Tricyclic, Ur Screen: NOT DETECTED

## 2020-08-18 LAB — CBC WITH DIFFERENTIAL/PLATELET
Abs Immature Granulocytes: 0.08 10*3/uL — ABNORMAL HIGH (ref 0.00–0.07)
Basophils Absolute: 0.1 10*3/uL (ref 0.0–0.1)
Basophils Relative: 1 %
Eosinophils Absolute: 0.4 10*3/uL (ref 0.0–0.5)
Eosinophils Relative: 2 %
HCT: 42.5 % (ref 39.0–52.0)
Hemoglobin: 14.8 g/dL (ref 13.0–17.0)
Immature Granulocytes: 1 %
Lymphocytes Relative: 23 %
Lymphs Abs: 3.8 10*3/uL (ref 0.7–4.0)
MCH: 29.7 pg (ref 26.0–34.0)
MCHC: 34.8 g/dL (ref 30.0–36.0)
MCV: 85.3 fL (ref 80.0–100.0)
Monocytes Absolute: 1.3 10*3/uL — ABNORMAL HIGH (ref 0.1–1.0)
Monocytes Relative: 8 %
Neutro Abs: 10.7 10*3/uL — ABNORMAL HIGH (ref 1.7–7.7)
Neutrophils Relative %: 65 %
Platelets: 243 10*3/uL (ref 150–400)
RBC: 4.98 MIL/uL (ref 4.22–5.81)
RDW: 12.8 % (ref 11.5–15.5)
WBC: 16.3 10*3/uL — ABNORMAL HIGH (ref 4.0–10.5)
nRBC: 0 % (ref 0.0–0.2)

## 2020-08-18 LAB — SARS CORONAVIRUS 2 (TAT 6-24 HRS): SARS Coronavirus 2: NEGATIVE

## 2020-08-18 SURGERY — INCISION AND DRAINAGE, ABSCESS
Anesthesia: General

## 2020-08-18 MED ORDER — HYDROCODONE-ACETAMINOPHEN 5-325 MG PO TABS: 1.0000 | ORAL_TABLET | Freq: Four times a day (QID) | ORAL | 0 refills | Status: DC | PRN

## 2020-08-18 MED ORDER — OXYCODONE HCL 5 MG/5ML PO SOLN: 5.0000 mg | Freq: Once | ORAL | Status: DC | PRN

## 2020-08-18 MED ORDER — DEXAMETHASONE SODIUM PHOSPHATE 10 MG/ML IJ SOLN
INTRAMUSCULAR | Status: DC | PRN
  Administered 2020-08-18: 10 mg via INTRAVENOUS

## 2020-08-18 MED ORDER — PROPOFOL 10 MG/ML IV BOLUS
INTRAVENOUS | Status: AC
  Filled 2020-08-18: qty 20

## 2020-08-18 MED ORDER — ONDANSETRON HCL 4 MG/2ML IJ SOLN
INTRAMUSCULAR | Status: DC | PRN
  Administered 2020-08-18: 4 mg via INTRAVENOUS

## 2020-08-18 MED ORDER — SODIUM CHLORIDE FLUSH 0.9 % IV SOLN
INTRAVENOUS | Status: AC
  Filled 2020-08-18: qty 10

## 2020-08-18 MED ORDER — FENTANYL CITRATE (PF) 100 MCG/2ML IJ SOLN
INTRAMUSCULAR | Status: DC | PRN
  Administered 2020-08-18: 50 ug via INTRAVENOUS
  Administered 2020-08-18 (×2): 100 ug via INTRAVENOUS

## 2020-08-18 MED ORDER — LIDOCAINE HCL (CARDIAC) PF 100 MG/5ML IV SOSY
PREFILLED_SYRINGE | INTRAVENOUS | Status: DC | PRN
  Administered 2020-08-18: 100 mg via INTRAVENOUS

## 2020-08-18 MED ORDER — LIDOCAINE HCL (PF) 2 % IJ SOLN
INTRAMUSCULAR | Status: AC
  Filled 2020-08-18: qty 5

## 2020-08-18 MED ORDER — CELECOXIB 200 MG PO CAPS
ORAL_CAPSULE | ORAL | Status: AC
  Administered 2020-08-18: 200 mg via ORAL
  Filled 2020-08-18: qty 1

## 2020-08-18 MED ORDER — BUPIVACAINE-EPINEPHRINE 0.25% -1:200000 IJ SOLN
INTRAMUSCULAR | Status: DC | PRN
  Administered 2020-08-18: 30 mL

## 2020-08-18 MED ORDER — MIDAZOLAM HCL 2 MG/2ML IJ SOLN
INTRAMUSCULAR | Status: AC
  Filled 2020-08-18: qty 2

## 2020-08-18 MED ORDER — PROMETHAZINE HCL 25 MG/ML IJ SOLN
6.2500 mg | INTRAMUSCULAR | Status: DC | PRN
  Administered 2020-08-18: 6.25 mg via INTRAVENOUS

## 2020-08-18 MED ORDER — BUPIVACAINE-EPINEPHRINE (PF) 0.25% -1:200000 IJ SOLN
INTRAMUSCULAR | Status: AC
  Filled 2020-08-18: qty 30

## 2020-08-18 MED ORDER — CHLORHEXIDINE GLUCONATE 0.12 % MT SOLN
OROMUCOSAL | Status: AC
  Administered 2020-08-18: 15 mL via OROMUCOSAL
  Filled 2020-08-18: qty 15

## 2020-08-18 MED ORDER — IBUPROFEN 800 MG PO TABS: 800.0000 mg | ORAL_TABLET | Freq: Three times a day (TID) | ORAL | 0 refills | Status: DC | PRN

## 2020-08-18 MED ORDER — OXYCODONE HCL 5 MG PO TABS: 5.0000 mg | ORAL_TABLET | Freq: Once | ORAL | Status: DC | PRN

## 2020-08-18 MED ORDER — GABAPENTIN 300 MG PO CAPS
ORAL_CAPSULE | ORAL | Status: AC
  Administered 2020-08-18: 300 mg via ORAL
  Filled 2020-08-18: qty 1

## 2020-08-18 MED ORDER — DEXMEDETOMIDINE HCL IN NACL 200 MCG/50ML IV SOLN
INTRAVENOUS | Status: DC | PRN
  Administered 2020-08-18 (×2): 20 ug via INTRAVENOUS

## 2020-08-18 MED ORDER — CHLORHEXIDINE GLUCONATE CLOTH 2 % EX PADS: 6.0000 | MEDICATED_PAD | Freq: Once | CUTANEOUS | Status: DC

## 2020-08-18 MED ORDER — MEPERIDINE HCL 50 MG/ML IJ SOLN: 6.2500 mg | INTRAMUSCULAR | Status: DC | PRN

## 2020-08-18 MED ORDER — DEXAMETHASONE SODIUM PHOSPHATE 10 MG/ML IJ SOLN
INTRAMUSCULAR | Status: AC
  Filled 2020-08-18: qty 1

## 2020-08-18 MED ORDER — ONDANSETRON HCL 4 MG/2ML IJ SOLN
INTRAMUSCULAR | Status: AC
  Filled 2020-08-18: qty 2

## 2020-08-18 MED ORDER — FENTANYL CITRATE (PF) 250 MCG/5ML IJ SOLN
INTRAMUSCULAR | Status: AC
  Filled 2020-08-18: qty 5

## 2020-08-18 MED ORDER — PROMETHAZINE HCL 25 MG/ML IJ SOLN
INTRAMUSCULAR | Status: AC
  Filled 2020-08-18: qty 1

## 2020-08-18 MED ORDER — SODIUM CHLORIDE 0.9 % IV SOLN
INTRAVENOUS | Status: AC
  Filled 2020-08-18: qty 2

## 2020-08-18 MED ORDER — FENTANYL CITRATE (PF) 100 MCG/2ML IJ SOLN
INTRAMUSCULAR | Status: AC
  Filled 2020-08-18: qty 2

## 2020-08-18 MED ORDER — ACETAMINOPHEN 500 MG PO TABS
ORAL_TABLET | ORAL | Status: AC
  Administered 2020-08-18: 1000 mg via ORAL
  Filled 2020-08-18: qty 2

## 2020-08-18 MED ORDER — PROPOFOL 10 MG/ML IV BOLUS
INTRAVENOUS | Status: DC | PRN
  Administered 2020-08-18: 250 mg via INTRAVENOUS

## 2020-08-18 MED ORDER — FENTANYL CITRATE (PF) 100 MCG/2ML IJ SOLN: 25.0000 ug | INTRAMUSCULAR | Status: DC | PRN

## 2020-08-18 MED ORDER — MIDAZOLAM HCL 2 MG/2ML IJ SOLN
INTRAMUSCULAR | Status: DC | PRN
  Administered 2020-08-18: 2 mg via INTRAVENOUS

## 2020-08-18 SURGICAL SUPPLY — 29 items
BLADE SURG 15 STRL LF DISP TIS (BLADE) ×1 IMPLANT
BLADE SURG 15 STRL SS (BLADE) ×3
CANISTER SUCT 3000ML PPV (MISCELLANEOUS) ×3 IMPLANT
COVER WAND RF STERILE (DRAPES) ×3 IMPLANT
DRAIN PENROSE 1/4X12 LTX STRL (WOUND CARE) IMPLANT
DRAIN PENROSE 5/8X18 LTX STRL (DRAIN) IMPLANT
DRAPE LAPAROTOMY 77X122 PED (DRAPES) ×3 IMPLANT
ELECT CAUTERY BLADE 6.4 (BLADE) ×3 IMPLANT
ELECT REM PT RETURN 9FT ADLT (ELECTROSURGICAL) ×3
ELECTRODE REM PT RTRN 9FT ADLT (ELECTROSURGICAL) ×1 IMPLANT
GAUZE PACKING 1/2X5YD (GAUZE/BANDAGES/DRESSINGS) ×3 IMPLANT
GAUZE SPONGE 4X4 12PLY STRL (GAUZE/BANDAGES/DRESSINGS) IMPLANT
GLOVE ORTHO TXT STRL SZ7.5 (GLOVE) ×3 IMPLANT
GOWN STRL REUS W/ TWL LRG LVL3 (GOWN DISPOSABLE) ×2 IMPLANT
GOWN STRL REUS W/TWL LRG LVL3 (GOWN DISPOSABLE) ×6
KIT TURNOVER KIT A (KITS) ×3 IMPLANT
MANIFOLD NEPTUNE II (INSTRUMENTS) ×3 IMPLANT
NEEDLE HYPO 22GX1.5 SAFETY (NEEDLE) ×3 IMPLANT
NS IRRIG 1000ML POUR BTL (IV SOLUTION) ×3 IMPLANT
PACK BASIN MINOR (MISCELLANEOUS) ×3 IMPLANT
PAD ABD DERMACEA PRESS 5X9 (GAUZE/BANDAGES/DRESSINGS) IMPLANT
SOL PREP PVP 2OZ (MISCELLANEOUS) ×3
SOLUTION PREP PVP 2OZ (MISCELLANEOUS) ×1 IMPLANT
SPONGE LAP 18X18 RF (DISPOSABLE) ×3 IMPLANT
SUT ETHILON 3-0 FS-10 30 BLK (SUTURE)
SUTURE EHLN 3-0 FS-10 30 BLK (SUTURE) IMPLANT
SWAB CULTURE AMIES ANAERIB BLU (MISCELLANEOUS) IMPLANT
SYR 10ML LL (SYRINGE) ×3 IMPLANT
SYR BULB IRRIG 60ML STRL (SYRINGE) ×3 IMPLANT

## 2020-08-18 NOTE — Op Note (Signed)
Rectal exam under anesthesia, incision and drainage perianal/perirectal abscess, subcutaneous fistulotomy.  Pre-operative Diagnosis: Perianal/perirectal abscess  Post-operative Diagnosis: same, with posterior subcutaneous fistula, and chronic/healed posterior fissure.  Surgeon: Campbell Lerner, M.D., FACS  Anesthesia: General LMA  Findings: Partially drained large left-sided abscess cavity.  Without appreciable communication to rectum or anal area.  Posterior healed fissure with scarred irregularity and associated subcutaneous fistula of approximately 2-1/2 to 3 cm length.  Estimated Blood Loss: 10 mL         Specimens: Anal skin tag overlying posterior fissure, not sentinel.          Complications: none              Procedure Details  The patient was seen again in the Holding Room. The benefits, complications, treatment options, and expected outcomes were discussed with the patient. The risks of bleeding, infection, recurrence of symptoms, failure to resolve symptoms, unanticipated injury, prosthetic placement, prosthetic infection, any of which could require further surgery were reviewed with the patient. The likelihood of improving the patient's symptoms with return to their baseline status is hopeful/expectant.  The patient and/or family concurred with the proposed plan, giving informed consent.  The patient was taken to Operating Room, identified and the procedure verified.    Prior to the induction of general anesthesia, antibiotic prophylaxis was administered. VTE prophylaxis was in place.  General anesthesia was then administered and tolerated well. After the induction, the patient was positioned in the lithotomy position and the anal area and buttocks was prepped with Betadine and draped in the sterile fashion.  A Time Out was held and the above information confirmed.  I evaluated the draining abscess cavity on the left side of the patient.  I probed it with a hemostat to identify  how to extend the opening.  Then extended it posteriorly and medially.  I was then able to more fully evaluate the cavity and determine any proximity to the anal rectal region.  The cavity is now widely opened to allow adequate drainage and hopefully allow healing by secondary intention. An anal retractor was placed identifying a chronic posterior fissure with some undermining laterally.  Some of the redundant anal Derm was excised as anal skin tag. There was a small punctum more posterior about 3 cm from the anal verge and with a lacrimal probe found that it communicated with a subcutaneous opening also in the posterior aspect.  This fistula was then laid open by dividing the overlying skin with electrocautery.  There is no sphincteric involvement. Then confirmed adequate hemostasis, having identified no additional pathology.  Local infiltration with quarter percent Marcaine with epi was done for postoperative pain control.  The wound was then packed with half-inch iodoform packing strip.  Dry dressing and ABD pad and mesh briefs were placed to secure the dressing.     Campbell Lerner M.D., [facility] 08/18/2020 4:14 PM

## 2020-08-18 NOTE — Anesthesia Postprocedure Evaluation (Signed)
Anesthesia Post Note  Patient: AMEIR FARIA  Procedure(s) Performed: INCISION AND DRAINAGE ABSCESS, perirectal abscess (N/A )  Patient location during evaluation: PACU Anesthesia Type: General Level of consciousness: awake and alert Pain management: pain level controlled Vital Signs Assessment: post-procedure vital signs reviewed and stable Respiratory status: spontaneous breathing, nonlabored ventilation, respiratory function stable and patient connected to nasal cannula oxygen Cardiovascular status: blood pressure returned to baseline and stable Postop Assessment: no apparent nausea or vomiting Anesthetic complications: no   No complications documented.   Last Vitals:  Vitals:   08/18/20 1657 08/18/20 1712  BP: 95/60 (!) 94/54  Pulse: 67 63  Resp: (!) 21 18  Temp:    SpO2: 93% 94%    Last Pain:  Vitals:   08/18/20 1712  TempSrc:   PainSc: Asleep                 Corinda Gubler

## 2020-08-18 NOTE — Anesthesia Preprocedure Evaluation (Signed)
Anesthesia Evaluation  Patient identified by MRN, date of birth, ID band Patient awake    Reviewed: Allergy & Precautions, NPO status , Patient's Chart, lab work & pertinent test results  History of Anesthesia Complications Negative for: history of anesthetic complications  Airway Mallampati: II  TM Distance: >3 FB Neck ROM: Full    Dental no notable dental hx.    Pulmonary asthma (mild intermittent) , Current SmokerPatient did not abstain from smoking.,    breath sounds clear to auscultation- rhonchi (-) wheezing      Cardiovascular Exercise Tolerance: Good (-) hypertension(-) CAD, (-) Past MI, (-) Cardiac Stents and (-) CABG  Rhythm:Regular Rate:Normal - Systolic murmurs and - Diastolic murmurs    Neuro/Psych neg Seizures negative neurological ROS  negative psych ROS   GI/Hepatic negative GI ROS, Neg liver ROS,   Endo/Other  negative endocrine ROSneg diabetes  Renal/GU negative Renal ROS     Musculoskeletal negative musculoskeletal ROS (+)   Abdominal (+) - obese,   Peds  Hematology negative hematology ROS (+)   Anesthesia Other Findings Past Medical History: No date: Asthma No date: Chronic back pain No date: Irregular heart beat No date: Pancreatitis No date: Proctocolitis   Reproductive/Obstetrics                             Anesthesia Physical Anesthesia Plan  ASA: II  Anesthesia Plan: General   Post-op Pain Management:    Induction: Intravenous  PONV Risk Score and Plan: 0 and Ondansetron  Airway Management Planned: LMA  Additional Equipment:   Intra-op Plan:   Post-operative Plan:   Informed Consent: I have reviewed the patients History and Physical, chart, labs and discussed the procedure including the risks, benefits and alternatives for the proposed anesthesia with the patient or authorized representative who has indicated his/her understanding and acceptance.      Dental advisory given  Plan Discussed with: CRNA and Anesthesiologist  Anesthesia Plan Comments:         Anesthesia Quick Evaluation

## 2020-08-18 NOTE — Transfer of Care (Signed)
Immediate Anesthesia Transfer of Care Note  Patient: Randall Shelton  Procedure(s) Performed: INCISION AND DRAINAGE ABSCESS, perirectal abscess (N/A )  Patient Location: PACU  Anesthesia Type:General  Level of Consciousness: sedated  Airway & Oxygen Therapy: Patient Spontanous Breathing and Patient connected to face mask oxygen  Post-op Assessment: Report given to RN and Post -op Vital signs reviewed and stable  Post vital signs: Reviewed and stable  Last Vitals:  Vitals Value Taken Time  BP 88/56 08/18/20 1627  Temp    Pulse 85 08/18/20 1631  Resp 18 08/18/20 1631  SpO2 97 % 08/18/20 1631  Vitals shown include unvalidated device data.  Last Pain:  Vitals:   08/18/20 1357  TempSrc: Temporal  PainSc: 0-No pain         Complications: No complications documented.

## 2020-08-18 NOTE — Discharge Instructions (Signed)

## 2020-08-18 NOTE — Anesthesia Procedure Notes (Signed)
Procedure Name: LMA Insertion Performed by: Darliss Cheney, CRNA Pre-anesthesia Checklist: Patient identified, Emergency Drugs available, Suction available and Patient being monitored Patient Re-evaluated:Patient Re-evaluated prior to induction Oxygen Delivery Method: Circle system utilized Preoxygenation: Pre-oxygenation with 100% oxygen Induction Type: IV induction Ventilation: Mask ventilation without difficulty and Oral airway inserted - appropriate to patient size LMA: LMA inserted LMA Size: 5.0 Tube type: Oral Number of attempts: 1 Airway Equipment and Method: Oral airway Placement Confirmation: positive ETCO2 and breath sounds checked- equal and bilateral Dental Injury: Teeth and Oropharynx as per pre-operative assessment

## 2020-08-18 NOTE — Interval H&P Note (Signed)
History and Physical Interval Note:  08/18/2020 2:59 PM  Randall Shelton  has presented today for surgery, with the diagnosis of Perirectal abscess.  The various methods of treatment have been discussed with the patient and family. After consideration of risks, benefits and other options for treatment, the patient has consented to  Procedure(s): INCISION AND DRAINAGE ABSCESS, perirectal abscess (N/A) as a surgical intervention.  The patient's history has been reviewed, patient examined, no change in status, stable for surgery.  I have reviewed the patient's chart and labs.  Questions were answered to the patient's satisfaction.   Pt reports spontaneous drainage occurred overnight, and feels much better today.  Wants to complete drainage procedure, and evaluate for cause, hopefully diminish risk of recurrence.   Campbell Lerner

## 2020-08-21 ENCOUNTER — Encounter: Payer: Self-pay | Admitting: Surgery

## 2020-08-22 ENCOUNTER — Ambulatory Visit (INDEPENDENT_AMBULATORY_CARE_PROVIDER_SITE_OTHER): Payer: Self-pay

## 2020-08-22 ENCOUNTER — Other Ambulatory Visit: Payer: Self-pay

## 2020-08-22 DIAGNOSIS — K611 Rectal abscess: Secondary | ICD-10-CM

## 2020-08-22 LAB — SURGICAL PATHOLOGY

## 2020-08-22 NOTE — Progress Notes (Signed)
Patient ID: Randall Shelton, male   DOB: 02/01/1981, 39 y.o.   MRN: [id number] Patient came in today for a wound check.  The wound is clean, with no signs of infection noted. Incision repacked with 6 inches of 1/2 inch iodoform gauze. Will wear dry gauze on the outside. Patient will return tomorrow for repacking. Will then return on Monday for wound check. Instructed to keep area covered if the packing falls out over the holiday weekend.

## 2020-08-22 NOTE — Patient Instructions (Addendum)
Return tomorrow for repacking of the area. Leave the packing in over the holiday weekend. If it falls out just keep the area covered.

## 2020-08-23 ENCOUNTER — Ambulatory Visit: Payer: Self-pay

## 2020-08-23 DIAGNOSIS — K611 Rectal abscess: Secondary | ICD-10-CM

## 2020-08-23 NOTE — Progress Notes (Signed)
Patient ID: Randall Shelton, male   DOB: Feb 14, 1981, 39 y.o.   MRN: [id number]  Patient came in today for a wound check.  The wound is clean, with no signs of infection noted. Packing removed and area repacked with 1/2 iodaform packing and gauze dressing applied. The patient will follow up on Monday for repacking.

## 2020-08-28 ENCOUNTER — Ambulatory Visit: Payer: Self-pay

## 2020-08-28 ENCOUNTER — Other Ambulatory Visit: Payer: Self-pay

## 2020-08-28 NOTE — Progress Notes (Signed)
Error

## 2020-08-29 ENCOUNTER — Encounter: Payer: Self-pay | Admitting: Physician Assistant

## 2020-08-29 ENCOUNTER — Ambulatory Visit (INDEPENDENT_AMBULATORY_CARE_PROVIDER_SITE_OTHER): Payer: Self-pay | Admitting: Physician Assistant

## 2020-08-29 VITALS — BP 124/78 | HR 60 | Temp 98.0°F | Ht 73.0 in | Wt 201.0 lb

## 2020-08-29 DIAGNOSIS — Z09 Encounter for follow-up examination after completed treatment for conditions other than malignant neoplasm: Secondary | ICD-10-CM

## 2020-08-29 DIAGNOSIS — K611 Rectal abscess: Secondary | ICD-10-CM

## 2020-08-29 NOTE — Patient Instructions (Signed)
Continue to pack the area daily. Out of work until seen on 09/05/20.

## 2020-08-29 NOTE — Progress Notes (Signed)
[facility] POST-OP OFFICE VISIT  08/29/2020  HPI: Randall Shelton is a 39 y.o. male 11 days s/p incision and drainage of perianal/perirectal abscess and subcutaneous fistulotomy with Dr Claudine Mouton  He is overall doing well No fever, chills He continues to do his best with packing and has been using [facility] nurses as well to help He has noticed some small blood or blood clots with wiping and his is not sure where this is coming from. Only intermittent in nature. Does not notice much blood, if any, with removing the packing.   Vital signs: BP 124/78   Pulse 60   Temp 98 F (36.7 C)   Ht 6\' 1"  (1.854 m)   Wt 201 lb (91.2 kg)   SpO2 97%   BMI 26.52 kg/m    Physical Exam: Constitutional: Well appearing male, NAD Skin: Small, ~ 1 x 1 cm I&D site to the left medical gluteal crease lateral to anal verge, entire wound bed is granulation tissue, I do not see any areas of bleeding, no active drainage  Assessment/Plan: This is a 39 y.o. male 11 days s/p incision and drainage of perianal/perirectal abscess and subcutaneous fistulotomy    - Continue packing daily and as needed  - No need for further intervention at this time  - He will follow up with Dr 24 as scheduled on 12/07  -- 14/07, PA-C [facility] 08/29/2020, 4:28 PM [phone] M-F: 7am - 4pm

## 2020-09-05 ENCOUNTER — Other Ambulatory Visit: Payer: Self-pay

## 2020-09-05 ENCOUNTER — Encounter: Payer: Self-pay | Admitting: Surgery

## 2020-09-05 ENCOUNTER — Ambulatory Visit (INDEPENDENT_AMBULATORY_CARE_PROVIDER_SITE_OTHER): Payer: Self-pay | Admitting: Surgery

## 2020-09-05 VITALS — BP 121/84 | HR 71 | Temp 98.2°F | Ht 73.0 in | Wt 200.8 lb

## 2020-09-05 DIAGNOSIS — K611 Rectal abscess: Secondary | ICD-10-CM

## 2020-09-05 NOTE — Patient Instructions (Addendum)
Continue to pack the area daily and keep clean. Once you cannot put any packing in just keep it covered until fully healed.  Continue to monitor your drainage.   May return to work with out any restrictions.   Follow up here in 6 weeks.

## 2020-09-05 NOTE — Progress Notes (Signed)
[facility] POST-OP OFFICE VISIT  09/05/2020  HPI: Randall Shelton is a 39 y.o. male 18 days s/p I&D perianal abscess.  Patient reports his usual 2-3 bowel movements per day, still with stearrhea due to his inability to afford pancreatic enzymes.  He reports diminished size of the abscess cavity, still with a yellowish discharge present on the packing strip.  He is able to continue packing it usually twice daily.  He had some concern about a small amount of blood per rectum initially but this is since resolved.  Vital signs: BP 121/84   Pulse 71   Temp 98.2 F (36.8 C)   Ht 6\' 1"  (1.854 m)   Wt 200 lb 12.8 oz (91.1 kg)   SpO2 95%   BMI 26.49 kg/m    Physical Exam: Constitutional: He appears well, he is ready and wants to go return to work. Abdomen: Benign, nontender. Skin: The perianal wound is about the depth of Q-tip head approximately less than 2 cm deep.  It appears to be well granulated.  There is no evidence of fistula present at this time.  Assessment/Plan: This is a 39 y.o. male 18 days s/p I&D of recurrent perianal abscess.  Patient Active Problem List   Diagnosis Date Noted  . 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 Perirectal abscess 12/02/2019  . Chronic back pain   . Irregular heart beat   . Proctocolitis   . Pancreatitis   . Asthma     -Advised to continue packing as tolerated.  Once he gets too small to be feasible discontinue packing but continue perianal cleansing with any bowel activity and at least 3 times daily.  We discussed the possibility that a fistula will reveal itself with time.  We discussed the possibilities of surgical intervention, the old form utilizing seton versus the possibility of utilizing fistula plug techniques, they are anticipated failure rate, etc.   02/01/2020 M.D., [facility] 09/05/2020, 12:14 PM

## 2020-09-07 ENCOUNTER — Telehealth: Payer: Self-pay | Admitting: *Deleted

## 2020-09-07 NOTE — Telephone Encounter (Signed)
Faxed FMLA to [employer] at [phone] 

## 2020-10-17 ENCOUNTER — Encounter: Payer: Self-pay | Admitting: Physician Assistant

## 2020-10-23 ENCOUNTER — Encounter: Payer: Self-pay | Admitting: Physician Assistant

## 2020-10-24 ENCOUNTER — Other Ambulatory Visit: Payer: Self-pay

## 2020-10-24 ENCOUNTER — Encounter: Payer: Self-pay | Admitting: Surgery

## 2020-10-24 ENCOUNTER — Ambulatory Visit (INDEPENDENT_AMBULATORY_CARE_PROVIDER_SITE_OTHER): Payer: Self-pay | Admitting: Surgery

## 2020-10-24 VITALS — BP 125/70 | HR 72 | Temp 98.1°F | Ht 73.0 in | Wt 202.4 lb

## 2020-10-24 DIAGNOSIS — Z09 Encounter for follow-up examination after completed treatment for conditions other than malignant neoplasm: Secondary | ICD-10-CM | POA: Insufficient documentation

## 2020-10-24 NOTE — Patient Instructions (Signed)
GENERAL POST-OPERATIVE PATIENT INSTRUCTIONS   WOUND CARE INSTRUCTIONS:  Keep a dry clean dressing on the wound if there is drainage. The initial bandage may be removed after 24 hours.  Once the wound has quit draining you may leave it open to air.  If clothing rubs against the wound or causes irritation and the wound is not draining you may cover it with a dry dressing during the daytime.  Try to keep the wound dry and avoid ointments on the wound unless directed to do so.  If the wound becomes bright red and painful or starts to drain infected material that is not clear, please contact your physician immediately.  If the wound is mildly pink and has a thick firm ridge underneath it, this is normal, and is referred to as a healing ridge.  This will resolve over the next 4-6 weeks.  BATHING: You may shower if you have been informed of this by your surgeon. However, Please do not submerge in a tub, hot tub, or pool until incisions are completely sealed or have been told by your surgeon that you may do so.  DIET:  You may eat any foods that you can tolerate.  It is a good idea to eat a high fiber diet and take in plenty of fluids to prevent constipation.  If you do become constipated you may want to take a mild laxative or take ducolax tablets on a daily basis until your bowel habits are regular.  Constipation can be very uncomfortable, along with straining, after recent surgery.  ACTIVITY:  You are encouraged to cough and deep breath or use your incentive spirometer if you were given one, every 15-30 minutes when awake.  This will help prevent respiratory complications and low grade fevers post-operatively if you had a general anesthetic.  You may want to hug a pillow when coughing and sneezing to add additional support to the surgical area, if you had abdominal or chest surgery, which will decrease pain during these times.  You are encouraged to walk and engage in light activity for the next two weeks.  You  should not lift more than 20 pounds, until 4 to 6 weeks after surgery as it could put you at increased risk for complications.  Twenty pounds is roughly equivalent to a plastic bag of groceries. At that time- Listen to your body when lifting, if you have pain when lifting, stop and then try again in a few days. Soreness after doing exercises or activities of daily living is normal as you get back in to your normal routine.  MEDICATIONS:  Try to take narcotic medications and anti-inflammatory medications, such as tylenol, ibuprofen, naprosyn, etc., with food.  This will minimize stomach upset from the medication.  Should you develop nausea and vomiting from the pain medication, or develop a rash, please discontinue the medication and contact your physician.  You should not drive, make important decisions, or operate machinery when taking narcotic pain medication.  SUNBLOCK Use sun block to incision area over the next year if this area will be exposed to sun. This helps decrease scarring and will allow you avoid a permanent darkened area over your incision.  QUESTIONS:  Please feel free to call our office if you have any questions, and we will be glad to assist you. [phone].    

## 2020-10-24 NOTE — Progress Notes (Signed)
[facility] POST-OP OFFICE VISIT  10/24/2020  HPI: Randall Shelton is a 40 y.o. male 2 months s/p I&D of peri-rectal abscess.  Denies drainage, pain or tenderness. Reports regular BMs without pain or unique discharge.    Vital signs: BP 125/70   Pulse 72   Temp 98.1 F (36.7 C) (Oral)   Ht 6\' 1"  (1.854 m)   Wt 202 lb 6.4 oz (91.8 kg)   SpO2 95%   BMI 26.70 kg/m    Physical Exam: Constitutional: appears well  Abdomen: soft, NT Skin: peri-anal skin is well healed w/o any evidence of induration , fistula, or erythema.  No tenderness.    Assessment/Plan: This is a 40 y.o. male 2 months s/p I&D, completely healed.   Patient Active Problem List   Diagnosis Date Noted  . 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 Perirectal abscess 12/02/2019  . Chronic back pain   . Irregular heart beat   . Proctocolitis   . Pancreatitis   . Asthma     - f/u as needed.    02/01/2020 M.D., FACS 10/24/2020, 11:02 AM

## 2020-11-10 ENCOUNTER — Telehealth: Payer: Self-pay | Admitting: Surgery

## 2020-11-10 MED ORDER — IBUPROFEN 800 MG PO TABS
800.0000 mg | ORAL_TABLET | Freq: Three times a day (TID) | ORAL | 0 refills | Status: DC | PRN
Start: 2020-11-10 — End: 2021-04-04

## 2020-11-10 MED ORDER — AMOXICILLIN-POT CLAVULANATE 875-125 MG PO TABS: 1.0000 | ORAL_TABLET | Freq: Two times a day (BID) | ORAL | 0 refills | Status: AC

## 2020-11-10 NOTE — Telephone Encounter (Signed)
Patient calls, he is having a recurrence with perirectal abscess.  He did have I&D done 08/18/20 w/Dr. Claudine Mouton.  I have scheduled him to see Dr. Claudine Mouton for 11/14/20.  Patient is wondering in the meantime if he can get an antibiotic going for over the weekend as he is having some pain, oozing and pressure at the area.  Please call him.  Thank you.

## 2020-11-10 NOTE — Telephone Encounter (Signed)
Continue sitz baths. Pick up medication at the pharmacy. Keep follow up appointment next week.

## 2020-11-14 ENCOUNTER — Ambulatory Visit (INDEPENDENT_AMBULATORY_CARE_PROVIDER_SITE_OTHER): Payer: Self-pay | Admitting: Surgery

## 2020-11-14 ENCOUNTER — Other Ambulatory Visit: Payer: Self-pay

## 2020-11-14 ENCOUNTER — Encounter: Payer: Self-pay | Admitting: Surgery

## 2020-11-14 VITALS — BP 108/72 | HR 67 | Temp 98.6°F | Ht 73.0 in | Wt 204.0 lb

## 2020-11-14 DIAGNOSIS — K611 Rectal abscess: Secondary | ICD-10-CM

## 2020-11-14 NOTE — Patient Instructions (Signed)
If you have any concerns or questions, please feel free to call our office.    Anal Fistula  An anal fistula is a hole that develops between the bowel and the skin near the anus. The anus allows stool (feces) to leave the body. The anus has many tiny glands that make lubricating fluid. Sometimes, these glands become plugged and infected. This can cause a fluid-filled pocket (abscess) to form. An anal fistula often occurs when an abscess becomes infected and then develops into a hole between the bowel and the skin. What are the causes? In most cases, an anal fistula is caused by a past or current buildup of pus around the anus (anal abscess). Other causes include:  A complication of surgery.  Injury to the rectum or the area around it.  Using high-energy beams (radiation) to treat the area around the rectum. What increases the risk? You are more likely to develop this condition if you have certain medical conditions or diseases, including:  Chronic inflammatory bowel disease, such as Crohn's disease or ulcerative colitis.  Colon cancer or rectal cancer.  Diverticular disease, such as diverticulitis.  A sexually transmitted infection, or STI, such as gonorrhea, chlamydia, or syphilis.  An infection that is caused by HIV. What are the signs or symptoms? Symptoms of this condition include:  Throbbing or constant pain that may be worse while you are sitting.  Swelling or irritation around the anus.  Pus or blood from an opening near the anus.  Pain when passing stool.  Fever or chills. How is this diagnosed? This condition is diagnosed based on:  A physical exam. This may include: ? An exam to find the external opening of the fistula. ? An exam with a probe or scope to help locate the internal opening of the fistula. ? An exam of the rectum with a gloved hand (digital rectal exam).  Imaging tests that use dye to find the exact location and path of the fistula. Tests may  include: ? X-rays. ? Ultrasound. ? CT scan. ? MRI.  Other tests to find the cause of the anal fistula. How is this treated? This condition is most commonly treated with surgery. The type of surgery that is used will depend on where the fistula is located and how complex the fistula is. Surgery may include:  A fistulotomy. The whole fistula is opened up, and the contents are drained to promote healing.  Seton placement. A silk string (seton) is placed into the fistula during a fistulotomy. This helps to drain any infection and promote healing.  Advancement flap procedure. Tissue is removed from your rectum or the skin around the anus and attached to the opening of the fistula.  Bioprosthetic plug. A cone-shaped plug is made from your tissue and is used to block the opening of the fistula. Some anal fistulas do not require surgery. A nonsurgical treatment option involves injecting a fibrin glue to seal the fistula. You also may be prescribed an antibiotic medicine to treat any infection. Follow these instructions at home: Medicines  Take over-the-counter and prescription medicines only as told by your health care provider.  If you were prescribed an antibiotic medicine, take it as told by your health care provider. Do not stop taking the antibiotic even if you start to feel better.  Use a stool softener or a laxative if told to do so by your health care provider. General instructions  Eat a high-fiber diet as told by your health care provider. This  can help to prevent constipation.  Drink enough fluid to keep your urine pale yellow.  Take a warm sitz bath for 15-20 minutes, 3-4 times per day, or as told by your health care provider. Sitz baths can ease your pain and discomfort and help with healing.  Follow good hygiene to keep the anal area as clean and dry as possible. Use wet toilet paper or a moist towelette after each bowel movement.  Keep all follow-up visits as told by your  health care provider. This is important.   Contact a health care provider if you have:  Increased pain that is not controlled with medicines.  New redness or swelling around the anal area.  New fluid, blood, or pus coming from the anal area.  Tenderness or warmth around the anal area. Get help right away if you have:  A fever.  Severe pain.  Chills or diarrhea.  Severe problems urinating or having a bowel movement. Summary  An anal fistula is a hole that develops between the bowel and the skin near the anus.  This condition is most often caused by a buildup of pus around the anus (anal abscess). Other causes include a complication of surgery, an injury to the rectum, or the use of radiation to treat the rectal area.  This condition is most commonly treated with surgery.  Follow your health care provider's instructions about taking medicines, eating and drinking, or taking sitz baths.  Call your health care provider if you have more pain, swelling, or blood. Get help right away if you have fever, severe pain, or problems passing urine or stool. This information is not intended to replace advice given to you by your health care provider. Make sure you discuss any questions you have with your health care provider. Document Revised: 01/30/2018 Document Reviewed: 01/30/2018 Elsevier Patient Education  2021 [employer].

## 2020-11-14 NOTE — Progress Notes (Signed)
[facility] POST-OP OFFICE VISIT  11/14/2020  HPI: Randall Shelton is a 40 y.o. male 2.5 months s/p I&D of peri-rectal abscess.  Denies drainage, pain or tenderness. Reports recurrence of pain and pressure, late last week, called in antibiotics for him utilize over the weekend to which he also added sitz bath's.  He has had spontaneous drainage in the interim with reduction in pain and tenderness.  Vital signs: BP 108/72   Pulse 67   Temp 98.6 F (37 C)   Ht 6\' 1"  (1.854 m)   Wt 204 lb (92.5 kg)   SpO2 96%   BMI 26.91 kg/m    Physical Exam: Constitutional: appears well  Abdomen: soft, NT Skin: peri-anal skin is without inflammatory changes, there is some induration around the recently drained abscess, there is a tract consistent with a likely fistula, this tract is probed to a depth of 2-1/2 to 3 cm.  Limited digital anal exam did not disclose an appreciable internal defect associated with it.  However there was the anticipated tenderness and this quadrant which appears to be the anterior left.  Assessment/Plan: This is a 40 y.o. 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   Patient Active Problem List   Diagnosis Date Noted  . Follow-up examination following surgery 10/24/2020  . 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 Perirectal abscess 12/02/2019  . Chronic back pain   . Irregular heart beat   . Proctocolitis   . Pancreatitis   . Asthma     -Due to his multiple recurrences recently, he desires to utilize treatment over the next few months with probing the tract to keep it open.  This is something he voluntarily inquired if he could do to help prevent its recurrence.  I believe this is why is it may be in the one opportunity he has to have the fistulous tract scar closed in lieu of an operation.  We will see him in follow-up in about 3 months.  02/01/2020 M.D., FACS 11/14/2020, 2:11 PM

## 2020-12-22 ENCOUNTER — Telehealth: Payer: Self-pay

## 2020-12-22 MED ORDER — AMOXICILLIN-POT CLAVULANATE 875-125 MG PO TABS: 1.0000 | ORAL_TABLET | Freq: Two times a day (BID) | ORAL | 0 refills | Status: AC

## 2020-12-22 NOTE — Telephone Encounter (Signed)
Perrectal abscess January 2022- patient states he is having swelling and pain in the same area-no drainage- low grade fever. He has been doing sitz baths in the morning and evenings. Instructed to take Tylenol and ibuprofen for the discomfort and pick up Augmentin medication at the pharmacy and keep scheduled appointment. Ok to send prescription per Dr.Rodenberg.  Patient notified of medication sent to pharmacy.

## 2020-12-26 ENCOUNTER — Ambulatory Visit: Payer: Self-pay | Admitting: Surgery

## 2020-12-26 ENCOUNTER — Encounter: Payer: Self-pay | Admitting: Surgery

## 2020-12-26 ENCOUNTER — Other Ambulatory Visit: Payer: Self-pay

## 2020-12-26 VITALS — BP 114/69 | HR 55 | Temp 98.3°F | Ht 73.0 in | Wt 197.0 lb

## 2020-12-26 DIAGNOSIS — K611 Rectal abscess: Secondary | ICD-10-CM

## 2020-12-26 NOTE — Progress Notes (Signed)
.  [facility] POST-OP OFFICE VISIT  12/26/2020  HPI: Randall Shelton is a 40 y.o. male  Reports recurrence of pain and pressure, late last week, began draining, and called in antibiotics to utilize over the weekend with the added sitz bath's.   He has had significant spontaneous drainage in the interim with reduction in pain and tenderness.  Months s/p initial I&D of peri-rectal abscess.    Vital signs: BP 114/69   Pulse (!) 55   Temp 98.3 F (36.8 C)   Ht 6\' 1"  (1.854 m)   Wt 197 lb (89.4 kg)   SpO2 95%   BMI 25.99 kg/m    Physical Exam: Constitutional: appears well  Abdomen: soft, NT Skin: peri-anal skin is without inflammatory changes, there is some induration around the recently drained abscess, there is a tract consistent with a likely fistula, this tract is probed to a depth of 1.5 cm.  Limited digital anal exam again did not disclose an appreciable internal defect associated with it.  However there was the anticipated tenderness and this quadrant which appears to be the anterior left.  Assessment/Plan: This is a 40 y.o. 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  This is something he voluntarily inquired if he could do to help prevent its recurrence.  I believe this is why is it may be in the one opportunity he has to have the fistulous tract scar closed in lieu of an operation.  We will see him in follow-up in about 4 to 6 weeks.  02/01/2020 M.D., FACS 12/26/2020, 3:49 PM

## 2020-12-26 NOTE — Patient Instructions (Addendum)
Continue with the sitz baths. Finish all of your antibiotics.   Follow-up with our office as needed.  Please call and ask to speak with a nurse if you develop questions or concerns.

## 2021-04-04 ENCOUNTER — Encounter: Payer: Self-pay | Admitting: Family Medicine

## 2021-04-04 ENCOUNTER — Other Ambulatory Visit: Payer: Self-pay

## 2021-04-04 ENCOUNTER — Ambulatory Visit: Payer: BC Managed Care – PPO | Admitting: Family Medicine

## 2021-04-04 VITALS — BP 100/72 | HR 59 | Temp 97.9°F | Ht 73.0 in | Wt 201.0 lb

## 2021-04-04 DIAGNOSIS — Z23 Encounter for immunization: Secondary | ICD-10-CM | POA: Diagnosis not present

## 2021-04-04 DIAGNOSIS — M722 Plantar fascial fibromatosis: Secondary | ICD-10-CM | POA: Insufficient documentation

## 2021-04-04 DIAGNOSIS — F17210 Nicotine dependence, cigarettes, uncomplicated: Secondary | ICD-10-CM | POA: Diagnosis not present

## 2021-04-04 DIAGNOSIS — Z716 Tobacco abuse counseling: Secondary | ICD-10-CM | POA: Diagnosis not present

## 2021-04-04 DIAGNOSIS — S50811A Abrasion of right forearm, initial encounter: Secondary | ICD-10-CM | POA: Insufficient documentation

## 2021-04-04 MED ORDER — MELOXICAM 15 MG PO TABS: 15.0000 mg | ORAL_TABLET | Freq: Every day | ORAL | 0 refills | Status: DC

## 2021-04-04 NOTE — Assessment & Plan Note (Addendum)
Tobacco Use: High Risk  . Smoking Tobacco Use: Every Day  . Smokeless Tobacco Use: Never   Findings reveal O2 saturation 97%, denies any dyspnea or shortness of air, lung fields reveal rhonchi in the left upper and left lower fields, otherwise benign, cardiac exam benign, patient no acute distress speaking comfortably.  Smoking cessation instruction/counseling given:  counseled patient on the dangers of tobacco use, advised patient to stop smoking, and reviewed strategies to maximize success inclusive of readiness to quit, importance of adjunct pharmacotherapy. Total duration of counseling 4 minutes.

## 2021-04-04 NOTE — Patient Instructions (Signed)
-   Start meloxicam, dose once daily with food x2 weeks, after 2 weeks can dose once daily on an as-needed basis for heel pain - Start home exercises for the heel/foot next week and continue until follow-up - Continue gentle wound care and cover when there is increased risk for exposure until fully healed, can use over-the-counter triple antibiotic ointment - Contact our office for any change in the wound including drainage, redness, streaking, swelling, pain, fever/chills - Return for follow-up in 4 weeks - Contact for any questions between now and then  Diet & Exercise Recommendations Dietary changes to include reducing saturated fats, sodium, avoiding red meat, fried, processed foods, full-fat dairy, baked goods, and sweets. Incorporate more fruits, vegetables, fiber-rich foods such as whole grains, and transition to more eggs and lean meats. Make realistic changes where you can and stick to it!  Physical activity should be focused on getting 150 to 300 minutes per week of moderate to vigorous activity (30 minutes of activity at least 5 days of the week at a level of intensity where you can carry on a conversation without being out of breath). However, any increase in activity is beneficial to your health! Physical activity reduces symptoms of depression and anxiety and improves sleep quality (increase the time in deep sleep and reduce daytime sleepiness). Benefits include weight loss, improved insulin sensitivity, less adiposity, and increased bone health. Walking lowers systolic and diastolic blood pressures as well as your resting heart rate.  Cognitive benefits of exercise include short-term improvements in executive function, memory, processing speed, attention, and academic performance Increasing physical activity in older adults can help them maintain independence by reducing cognitive decline and falls.

## 2021-04-04 NOTE — Assessment & Plan Note (Signed)
This is a chronic issue with recent exacerbation over the past 1-2 months, but this coincided with new job and activity requirements.  Pain is localized to the left heel, mild radiation into the insertional Achilles, noted with motion after period of rest such as first step in the morning and getting out of his car.  Treatment has been with sporadic OTC NSAID usage, relative rest.  He does give history of 4-5 foot drop onto a dock where his left heel struck a nail head years prior.  Examination reveals full range of motion at the left ankle, no abnormalities to inspection, focal maximal tenderness at the medial aspect anteriorly of his left calcaneus, mild tenderness at the Achilles during maximal dorsiflexion, neurovascularly intact, and sensorimotor intact.  I have advised scheduled meloxicam for 2 weeks then as needed, silicone heel cup inserts, home-based rehab starting next week, and reevaluation in 4 weeks.  Pending progress, can escalate to ultrasound-guided injection, formal physical therapy, or continued pharmacotherapy.

## 2021-04-04 NOTE — Progress Notes (Signed)
Primary Care / Sports Medicine Office Visit  Patient Information:  Patient ID: Randall Shelton, male DOB: [birth year] Age: 40 y.o. MRN: [id number]   Randall Shelton is a pleasant 40 y.o. male presenting with the following:  Chief Complaint  Patient presents with   New Patient (Initial Visit)   Establish Care   Abrasion    Right forearm and hand abrasion from rusty nail 04/02/21; not up to date on tetanus shot   Foot Pain    Left heel; x1-2 months; takes ibuprofen and Tylenol as needed for relief; intermittent; 4/10 pain    Review of Systems pertinent details above   Patient Active Problem List   Diagnosis Date Noted   Abrasion of right forearm 04/04/2021   Plantar fasciitis, left 04/04/2021   Tobacco abuse counseling 04/04/2021   Follow-up examination following surgery 10/24/2020   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   albuterol (PROVENTIL HFA;VENTOLIN HFA) 108 (90 Base) MCG/ACT inhaler Inhale 2 puffs into the lungs every 6 (six) hours as needed for wheezing or shortness of breath.   ibuprofen (ADVIL) 200 MG tablet Take 400 mg by mouth every 6 (six) hours as needed.   meloxicam (MOBIC) 15 MG tablet Take 1 tablet (15 mg total) by mouth daily.   [DISCONTINUED] ibuprofen (ADVIL) 800 MG tablet Take 1 tablet (800 mg total) by mouth every 8 (eight) hours as needed.   No facility-administered encounter medications on file as of 04/04/2021.   Past Surgical History:  Procedure Laterality Date   INCISION AND DRAINAGE ABSCESS N/A 08/18/2020    Procedure: INCISION AND DRAINAGE ABSCESS, perirectal abscess;  Surgeon: Campbell Lerner, MD;  Location: [facility] ORS;  Service: General;  Laterality: N/A;   WISDOM TOOTH EXTRACTION Bilateral 2002    Vitals:   04/04/21 0847  BP: 100/72  Pulse: (!) 59  Temp: 97.9 F (36.6 C)  SpO2: 97%   Vitals:   04/04/21 0847  Weight: 201 lb (91.2 kg)  Height: 6\' 1"  (1.854 m)   Body mass index is 26.52 kg/m.  No results found.   Independent interpretation of notes and tests performed by another provider:   None  Procedures performed:   None  Pertinent History, Exam, Impression, and Recommendations:   Abrasion of right forearm Right-hand-dominant patient with right forearm abrasion sustained on 04/02/2021 while clearing 2 by 4 wood.  He states that as he was disposing of it would, a rusty nail struck his right forearm dorsal hand/wrist.  He is not up-to-date on his tetanus vaccination.  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  On examination today he is afebrile, there is a linear roughly 10 cm healing laceration with additional involvement at the dorsal wrist/hand where there is a 3-4 cm healing laceration with components of abrasion in the periphery, not erythematous base, granulation tissue is noted, no swelling, palpation reveals no tenderness, no discharge  appreciated, no streaking.  I have advised close monitoring, daily wound care, and Tdap was administered to the patient today.  He was advised to contact us for any change in his clinical picture that would necessitate initiation of antibiotics.  He can follow-up on an as-needed basis for this issue otherwise.  Plantar fasciitis, left This is a chronic issue with recent exacerbation over the past 1-2 months, but this coincided with new job and activity requirements.  Pain is localized to the left heel, mild radiation into the insertional Achilles, noted with motion after  period of rest such as first step in the morning and getting out of his car.  Treatment has been with sporadic OTC NSAID usage, relative rest.  He does give history of 4-5 foot drop onto a dock where his left heel struck a nail head years prior.  Examination reveals full range of motion at the left ankle, no abnormalities to inspection, focal maximal tenderness at the medial aspect anteriorly of his left calcaneus, mild tenderness at the Achilles during maximal dorsiflexion, neurovascularly intact, and sensorimotor intact.  I have advised scheduled meloxicam for 2 weeks then as needed, silicone heel cup inserts, home-based rehab starting next week, and reevaluation in 4 weeks.  Pending progress, can escalate to ultrasound-guided injection, formal physical therapy, or continued pharmacotherapy.  Tobacco abuse counseling Tobacco Use: High Risk   Smoking Tobacco Use: Every Day   Smokeless Tobacco Use: Never   Findings reveal O2 saturation 97%, denies any dyspnea or shortness of air, lung fields reveal rhonchi in the left upper and left lower fields, otherwise benign, cardiac exam benign, patient no acute distress speaking comfortably.  Smoking cessation instruction/counseling given:  counseled patient on the dangers of tobacco use, advised patient to stop smoking, and reviewed strategies to maximize success inclusive of readiness to quit, importance of adjunct pharmacotherapy. Total duration of counseling 4 minutes.    Orders & Medications Meds ordered this encounter  Medications   meloxicam (MOBIC) 15 MG tablet    Sig: Take 1 tablet (15 mg total) by mouth daily.    Dispense:  30 tablet    Refill:  0   Orders Placed This Encounter  Procedures   Tdap vaccine greater than or equal to 7yo IM     Return in about 4 weeks (around 05/02/2021) for annual physical.     Jerrol Banana, MD   Primary Care Sports Medicine [facility] [place]

## 2021-04-04 NOTE — Assessment & Plan Note (Signed)
Right-hand-dominant patient with right forearm abrasion sustained on 04/02/2021 while clearing 2 by 4 wood.  He states that as he was disposing of it would, a rusty nail struck his right forearm dorsal hand/wrist.  He is not up-to-date on his tetanus vaccination.  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  On examination today he is afebrile, there is a linear roughly 10 cm healing laceration with additional involvement at the dorsal wrist/hand where there is a 3-4 cm healing laceration with components of abrasion in the periphery, not erythematous base, granulation tissue is noted, no swelling, palpation reveals no tenderness, no discharge appreciated, no streaking.  I have advised close monitoring, daily wound care, and Tdap was administered to the patient today.  He was advised to contact us for any change in his clinical picture that would necessitate initiation of antibiotics.  He can follow-up on an as-needed basis for this issue otherwise.

## 2021-04-09 ENCOUNTER — Ambulatory Visit: Payer: Self-pay | Admitting: Family Medicine

## 2021-04-09 ENCOUNTER — Telehealth: Payer: Self-pay

## 2021-04-09 ENCOUNTER — Encounter: Payer: Self-pay | Admitting: Family Medicine

## 2021-04-09 ENCOUNTER — Other Ambulatory Visit: Payer: Self-pay | Admitting: Family Medicine

## 2021-04-09 DIAGNOSIS — S50811A Abrasion of right forearm, initial encounter: Secondary | ICD-10-CM

## 2021-04-09 MED ORDER — CEPHALEXIN 500 MG PO CAPS: 500.0000 mg | ORAL_CAPSULE | Freq: Two times a day (BID) | ORAL | 0 refills | Status: DC

## 2021-04-09 NOTE — Telephone Encounter (Signed)
Please advise for medication management. 

## 2021-04-09 NOTE — Telephone Encounter (Signed)
    Primary Care / Sports Medicine Telephone Note  Patient Information:  Patient ID: Randall Shelton, male DOB: 1980-11-29 Age: 40 y.o. MRN: [id number]   Patient describes abrasion as having continued healing over the interval with a faint residual erythematous margin, unfortunately struck the arm at work and noted reddish colored discharge with possible yellow component.  I discussed the limitations of addressing this over the phone but did advise wound care as previous, keeping the area clean and covered, and if symptoms progressed to initiate antibiotics which were prescribed today.  I also offered video visit as a possible way to see the wound and he will consider this if symptoms fail to improve.    Jerrol Banana, MD   Primary Care Sports Medicine [facility]

## 2021-04-09 NOTE — Telephone Encounter (Signed)
Copied from CRM [id number]. Topic: General - Other >> Apr 09, 2021  2:47 PM Marylen Ponto wrote: Reason for CRM: Pt called and stated he is at work and will not be able to come in for the appt. Pt stated in his last visit Dr. Ashley Royalty told him if an antibiotic is needed to reach out to him so a Rx could be sent in. Pt requests that the Rx for an antibiotic be sent to [facility] [address], [place] - [address]

## 2021-05-02 ENCOUNTER — Ambulatory Visit
Admission: RE | Admit: 2021-05-02 | Discharge: 2021-05-02 | Disposition: A | Discharge status: Home / Self Care | Payer: BC Managed Care – PPO | Source: Ambulatory Visit | Attending: Family Medicine | Admitting: Family Medicine

## 2021-05-02 ENCOUNTER — Encounter: Payer: Self-pay | Admitting: Family Medicine

## 2021-05-02 ENCOUNTER — Ambulatory Visit (INDEPENDENT_AMBULATORY_CARE_PROVIDER_SITE_OTHER): Payer: BC Managed Care – PPO | Admitting: Family Medicine

## 2021-05-02 ENCOUNTER — Ambulatory Visit
Admission: RE | Admit: 2021-05-02 | Discharge: 2021-05-02 | Disposition: A | Discharge status: Home / Self Care | Payer: BC Managed Care – PPO | Attending: Family Medicine | Admitting: Family Medicine

## 2021-05-02 ENCOUNTER — Other Ambulatory Visit: Payer: Self-pay

## 2021-05-02 VITALS — BP 108/78 | HR 63 | Temp 97.6°F | Ht 73.0 in | Wt 201.0 lb

## 2021-05-02 DIAGNOSIS — L02831 Carbuncle of head [any part, except face]: Secondary | ICD-10-CM | POA: Diagnosis not present

## 2021-05-02 DIAGNOSIS — R0602 Shortness of breath: Secondary | ICD-10-CM

## 2021-05-02 DIAGNOSIS — J4520 Mild intermittent asthma, uncomplicated: Secondary | ICD-10-CM | POA: Insufficient documentation

## 2021-05-02 DIAGNOSIS — M722 Plantar fascial fibromatosis: Secondary | ICD-10-CM | POA: Diagnosis not present

## 2021-05-02 DIAGNOSIS — Z0000 Encounter for general adult medical examination without abnormal findings: Secondary | ICD-10-CM | POA: Diagnosis not present

## 2021-05-02 DIAGNOSIS — J4530 Mild persistent asthma, uncomplicated: Secondary | ICD-10-CM | POA: Insufficient documentation

## 2021-05-02 DIAGNOSIS — S50811A Abrasion of right forearm, initial encounter: Secondary | ICD-10-CM

## 2021-05-02 MED ORDER — CEPHALEXIN 500 MG PO CAPS: 500.0000 mg | ORAL_CAPSULE | Freq: Two times a day (BID) | ORAL | 0 refills | Status: DC

## 2021-05-02 MED ORDER — DICLOFENAC SODIUM 75 MG PO TBEC: 75.0000 mg | DELAYED_RELEASE_TABLET | Freq: Two times a day (BID) | ORAL | 0 refills | Status: DC

## 2021-05-02 NOTE — Patient Instructions (Addendum)
-   Obtain fasting labs - Obtain x-ray - Dose antibiotics and follow-up with dermatology - Dose diclofenac every 12 hours as-needed (okay to continue Tylenol as-needed) - Start physical therapy once scheduled [facility]:  [place]:  [phone]  [place]: [phone]

## 2021-05-02 NOTE — Assessment & Plan Note (Addendum)
Chronic issue that is still symptomatic with no change following 2 week course of meloxicam and home exercises. He now doses Tylenol with some response and uses heel cup inserts. We discussed next steps and he will start physical therapy, transition to diclofenac, prescribed today, and return in 4 weeks for reevaluation. If still symptomatic, consider ultrasound-guided injection. If improved, wean from PT, ramp up activity, focus on maintenance of symptom control.

## 2021-05-02 NOTE — Assessment & Plan Note (Signed)
Examination completed, guidance provided, risk stratification labs ordered.

## 2021-05-02 NOTE — Assessment & Plan Note (Signed)
>>  ASSESSMENT AND PLAN FOR MILD INTERMITTENT ASTHMA WITHOUT COMPLICATION WRITTEN ON 05/02/2021 10:34 AM BY Rodneshia Greenhouse, Ocie Bob, MD  Patient reports chronic history of asthma. The patient has been previously diagnosed with asthma. Symptoms have previously included dyspnea, non-productive cough and wheezing. Associated symptoms include dyspnea.  Suspected precipitants include dust, fumes and strong odors.  Symptoms have been stable since their onset. Observed precipitants include strong odors.  Current limitations in activity from asthma include none.  Number of days of school or work missed in the last month: 0. The previous exacerbation occurred 2 months ago. The patient reports adherence to this regimen  He has not required his rescue inhaler in 2 months, the last time was after strong perfume odor noted and he had excellent response. We will monitor this issue and I have stressed importance of having his inhaler with him at all times.

## 2021-05-02 NOTE — Assessment & Plan Note (Signed)
Carbuncle noted on occiput under hair, referral to dermatology placed for possible incision & drainage versus alternate management options at their discretion, over the interim Keflex prescribed and keeping area clean.

## 2021-05-02 NOTE — Assessment & Plan Note (Signed)
This issue is resolving and nearly resolved, gentle daily wound care - he never required recently Rx antibiotics as it improved without.

## 2021-05-02 NOTE — Assessment & Plan Note (Signed)
Patient reports chronic history of asthma. The patient has been previously diagnosed with asthma. Symptoms have previously included dyspnea, non-productive cough and wheezing. Associated symptoms include dyspnea.  Suspected precipitants include dust, fumes and strong odors.  Symptoms have been stable since their onset. Observed precipitants include strong odors.  Current limitations in activity from asthma include none.  Number of days of school or work missed in the last month: 0. The previous exacerbation occurred 2 months ago. The patient reports adherence to this regimen  He has not required his rescue inhaler in 2 months, the last time was after strong perfume odor noted and he had excellent response. We will monitor this issue and I have stressed importance of having his inhaler with him at all times.

## 2021-05-02 NOTE — Progress Notes (Signed)
Annual Physical Exam Visit  Patient Information:  Patient ID: Randall Shelton, male DOB: [birth year] Age: 40 y.o. MRN: [id number]   Subjective:   CC: Annual Physical Exam  HPI:  Randall Shelton is here for their annual physical.  I reviewed the past medical history, family history, social history, surgical history, and allergies today and changes were made as necessary.  Please see the problem list section below for additional details.  Health Habits Eye Exam: Awhile (recommended yearly) Dental Exam: Last 1+ year (recommended every 6 months) Exercise: Physical work (on feet), hitting 30 minutes 5 days a week Diet: Lately outside foods, trying more foods at home  Sexual Health Sexually active: Not currently, yes Current contraception: condoms  Past Medical History: Past Medical History:  Diagnosis Date   Asthma    Chronic back pain    Irregular heart beat    Pancreatitis    Proctocolitis    Past Surgical History: Past Surgical History:  Procedure Laterality Date   INCISION AND DRAINAGE ABSCESS N/A 08/18/2020   Procedure: INCISION AND DRAINAGE ABSCESS, perirectal abscess;  Surgeon: Campbell Lerner, MD;  Location: [facility] ORS;  Service: General;  Laterality: N/A;   WISDOM TOOTH EXTRACTION Bilateral 2002   Social History: Social History   Socioeconomic History   Marital status: Single    Spouse name: Not on file   Number of children: 1   Years of education: 9   Highest education level: 9th grade  Occupational History   Not on file  Tobacco Use   Smoking status: Every Day    Packs/day: 1.00    Years: 25.00    Pack years: 25.00    Types: Cigarettes   Smokeless tobacco: Never  Vaping Use   Vaping Use: Former  Substance and Sexual Activity   Alcohol use: Yes   Drug use: Yes    Types: Marijuana, Cocaine   Sexual activity: Not Currently    Birth control/protection: Condom  Other Topics Concern   Not on file  Social History Narrative   Not on file   Social  Determinants of Health   Financial Resource Strain: Not on file  Food Insecurity: Not on file  Transportation Needs: Not on file  Physical Activity: Not on file  Stress: Not on file  Social Connections: Not on file   Family History: Family History  Problem Relation Age of Onset   COPD Mother        Smoker   Hiatal hernia Mother    Heart disease Father    Hypertension Father    Diverticulitis Father    Bone cancer Father    Seizures Sister    Heart disease Sister    High Cholesterol Son    Diabetes Mellitus II Maternal Grandmother    Pneumonia Maternal Grandmother    Cancer Maternal Grandfather    Heart attack Paternal Grandmother    Pneumonia Paternal Grandfather    Allergies: No Known Allergies Health Maintenance: Health Maintenance  Topic Date Due   Pneumococcal Vaccine 5-8 Years old (1 - PCV) Never done   HIV Screening  Never done   Hepatitis C Screening  Never done   INFLUENZA VACCINE  04/30/2021   COVID-19 Vaccine (1) 05/18/2021 (Originally [birth year]1986)   TETANUS/TDAP  04/05/2031   HPV VACCINES  Aged Out    HM Colonoscopy     This patient has no relevant Health Maintenance data.      Medications: Current Outpatient Medications on File Prior to Visit  Medication Sig Dispense Refill   acetaminophen (TYLENOL) 500 MG tablet Take 1,000 mg by mouth every 6 (six) hours as needed for mild pain or moderate pain.     albuterol (PROVENTIL HFA;VENTOLIN HFA) 108 (90 Base) MCG/ACT inhaler Inhale 2 puffs into the lungs every 6 (six) hours as needed for wheezing or shortness of breath. 1 Inhaler 0   ibuprofen (ADVIL) 200 MG tablet Take 400 mg by mouth every 6 (six) hours as needed.     No current facility-administered medications on file prior to visit.    Review of Systems: No headache, visual changes, nausea, vomiting, diarrhea, constipation, dizziness, abdominal pain, skin rash, fevers, chills, night sweats, swollen lymph nodes, weight loss, chest pain, body aches,  joint swelling, muscle aches, + mild shortness of breath, mood changes, visual or auditory hallucinations reported.  Objective:   Vitals:   05/02/21 0813  BP: 108/78  Pulse: 63  Temp: 97.6 F (36.4 C)  SpO2: 96%   Vitals:   05/02/21 0813  Weight: 201 lb (91.2 kg)  Height: 6\' 1"  (1.854 m)   Body mass index is 26.52 kg/m.  General: Well Developed, well nourished, and in no acute distress.  Neuro: Alert and oriented x3, extra-ocular muscles intact, sensation grossly intact. Cranial nerves II through XII are intact, motor, sensory, and coordinative functions are all intact. HEENT: Normocephalic, atraumatic, pupils equal round reactive to light, neck supple, no masses, no lymphadenopathy, thyroid nonpalpable. Oropharynx, nasopharynx, external ear canals are unremarkable. Skin: Warm and dry, no rashes noted. There is a well-circumscribed 1x1cm circular lesion at the occiput near the hair line, under the hair, tightly grouped cluster of pustules with erythematous base  Cardiac: Regular rate and rhythm, no murmurs rubs or gallops. No peripheral edema. Pulses symmetric. Respiratory: Coarse expiratory breath sounds without wheeze in right lower field, otherwise clear to auscultation bilaterally. Not using accessory muscles, speaking in full sentences.  Abdominal: Soft, nontender, nondistended, positive bowel sounds, no masses, no organomegaly. Musculoskeletal: Shoulder, elbow, wrist, hip, knee, ankle stable, and with full range of motion.    Impression and Recommendations:   The patient was counselled, risk factors were discussed, and anticipatory guidance given.  Mild intermittent asthma without complication Patient reports chronic history of asthma. The patient has been previously diagnosed with asthma. Symptoms have previously included dyspnea, non-productive cough and wheezing. Associated symptoms include dyspnea.  Suspected precipitants include dust, fumes and strong odors.  Symptoms  have been stable since their onset. Observed precipitants include strong odors.  Current limitations in activity from asthma include none.  Number of days of school or work missed in the last month: 0. The previous exacerbation occurred 2 months ago. The patient reports adherence to this regimen  He has not required his rescue inhaler in 2 months, the last time was after strong perfume odor noted and he had excellent response. We will monitor this issue and I have stressed importance of having his inhaler with him at all times.  Abrasion of right forearm This issue is resolving and nearly resolved, gentle daily wound care - he never required recently Rx antibiotics as it improved without.  Plantar fasciitis, left Chronic issue that is still symptomatic with no change following 2 week course of meloxicam and home exercises. He now doses Tylenol with some response and uses heel cup inserts. We discussed next steps and he will start physical therapy, transition to diclofenac, prescribed today, and return in 4 weeks for reevaluation. If still symptomatic, consider ultrasound-guided injection. If  improved, wean from PT, ramp up activity, focus on maintenance of symptom control.  Carbuncle of head except face Carbuncle noted on occiput under hair, referral to dermatology placed for possible incision & drainage versus alternate management options at their discretion, over the interim Keflex prescribed and keeping area clean.  Annual physical exam Examination completed, guidance provided, risk stratification labs ordered.   Orders & Medications Meds ordered this encounter  Medications   cephALEXin (KEFLEX) 500 MG capsule    Sig: Take 1 capsule (500 mg total) by mouth 2 (two) times daily.    Dispense:  14 capsule    Refill:  0   diclofenac (VOLTAREN) 75 MG EC tablet    Sig: Take 1 tablet (75 mg total) by mouth 2 (two) times daily.    Dispense:  30 tablet    Refill:  0   Orders Placed This  Encounter  Procedures   DG Chest 2 View   HIV antibody (with reflex)   Hepatitis C Antibody   TSH Rfx on Abnormal to Free T4   CBC   Lipid panel   Comprehensive metabolic panel   VITAMIN D 25 Hydroxy (Vit-D Deficiency, Fractures)   Ambulatory referral to Physical Therapy   Ambulatory referral to Dermatology     Return in about 4 weeks (around 05/30/2021).    Jerrol Banana, MD   Primary Care Sports Medicine [facility]

## 2021-05-03 DIAGNOSIS — L02831 Carbuncle of head [any part, except face]: Secondary | ICD-10-CM | POA: Diagnosis not present

## 2021-05-03 DIAGNOSIS — L700 Acne vulgaris: Secondary | ICD-10-CM | POA: Diagnosis not present

## 2021-05-07 ENCOUNTER — Other Ambulatory Visit
Admission: RE | Admit: 2021-05-07 | Discharge: 2021-05-07 | Disposition: A | Discharge status: Home / Self Care | Payer: BC Managed Care – PPO | Attending: Family Medicine | Admitting: Family Medicine

## 2021-05-07 DIAGNOSIS — R0602 Shortness of breath: Secondary | ICD-10-CM | POA: Insufficient documentation

## 2021-05-07 DIAGNOSIS — Z79899 Other long term (current) drug therapy: Secondary | ICD-10-CM | POA: Insufficient documentation

## 2021-05-07 DIAGNOSIS — Z0000 Encounter for general adult medical examination without abnormal findings: Secondary | ICD-10-CM | POA: Insufficient documentation

## 2021-05-07 DIAGNOSIS — S50811A Abrasion of right forearm, initial encounter: Secondary | ICD-10-CM | POA: Insufficient documentation

## 2021-05-07 DIAGNOSIS — J4520 Mild intermittent asthma, uncomplicated: Secondary | ICD-10-CM | POA: Insufficient documentation

## 2021-05-07 DIAGNOSIS — L02831 Carbuncle of head [any part, except face]: Secondary | ICD-10-CM | POA: Insufficient documentation

## 2021-05-07 LAB — COMPREHENSIVE METABOLIC PANEL
ALT: 11 U/L (ref 0–44)
AST: 14 U/L — ABNORMAL LOW (ref 15–41)
Albumin: 3.9 g/dL (ref 3.5–5.0)
Alkaline Phosphatase: 63 U/L (ref 38–126)
Anion gap: 4 — ABNORMAL LOW (ref 5–15)
BUN: 13 mg/dL (ref 6–20)
CO2: 26 mmol/L (ref 22–32)
Calcium: 8.8 mg/dL — ABNORMAL LOW (ref 8.9–10.3)
Chloride: 106 mmol/L (ref 98–111)
Creatinine, Ser: 1.15 mg/dL (ref 0.61–1.24)
GFR, Estimated: >60 mL/min (ref >60)
Glucose, Bld: 95 mg/dL (ref 70–99)
Potassium: 4.1 mmol/L (ref 3.5–5.1)
Sodium: 136 mmol/L (ref 135–145)
Total Bilirubin: 0.5 mg/dL (ref 0.3–1.2)
Total Protein: 7 g/dL (ref 6.5–8.1)

## 2021-05-07 LAB — LIPID PANEL
Cholesterol: 170 mg/dL (ref 0–200)
HDL: 30 mg/dL — ABNORMAL LOW (ref >40)
LDL Cholesterol: 111 mg/dL — ABNORMAL HIGH (ref 0–99)
Total CHOL/HDL Ratio: 5.7 RATIO
Triglycerides: 143 mg/dL (ref <150)
VLDL: 29 mg/dL (ref 0–40)

## 2021-05-07 LAB — HEPATITIS C ANTIBODY: HCV Ab: NONREACTIVE

## 2021-05-07 LAB — CBC
HCT: 40.3 % (ref 39.0–52.0)
Hemoglobin: 14 g/dL (ref 13.0–17.0)
MCH: 29.6 pg (ref 26.0–34.0)
MCHC: 34.7 g/dL (ref 30.0–36.0)
MCV: 85.2 fL (ref 80.0–100.0)
Platelets: 205 10*3/uL (ref 150–400)
RBC: 4.73 MIL/uL (ref 4.22–5.81)
RDW: 12.7 % (ref 11.5–15.5)
WBC: 7.5 10*3/uL (ref 4.0–10.5)
nRBC: 0 % (ref 0.0–0.2)

## 2021-05-07 LAB — HIV ANTIBODY (ROUTINE TESTING W REFLEX): HIV Screen 4th Generation wRfx: NONREACTIVE

## 2021-05-07 LAB — VITAMIN D 25 HYDROXY (VIT D DEFICIENCY, FRACTURES): Vit D, 25-Hydroxy: 34.88 ng/mL (ref 30–100)

## 2021-05-08 ENCOUNTER — Ambulatory Visit: Payer: BC Managed Care – PPO | Attending: Family Medicine | Admitting: Physical Therapy

## 2021-05-08 ENCOUNTER — Encounter: Payer: Self-pay | Admitting: Physical Therapy

## 2021-05-08 ENCOUNTER — Other Ambulatory Visit: Payer: Self-pay

## 2021-05-08 DIAGNOSIS — M722 Plantar fascial fibromatosis: Secondary | ICD-10-CM | POA: Diagnosis not present

## 2021-05-08 DIAGNOSIS — M79672 Pain in left foot: Secondary | ICD-10-CM | POA: Insufficient documentation

## 2021-05-08 DIAGNOSIS — M25675 Stiffness of left foot, not elsewhere classified: Secondary | ICD-10-CM | POA: Insufficient documentation

## 2021-05-08 LAB — MISC LABCORP TEST (SEND OUT): Labcorp test code: 349829
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
## 2021-05-10 ENCOUNTER — Other Ambulatory Visit: Payer: Self-pay

## 2021-05-10 ENCOUNTER — Ambulatory Visit: Payer: BC Managed Care – PPO | Admitting: Physical Therapy

## 2021-05-10 ENCOUNTER — Encounter: Payer: Self-pay | Admitting: Physical Therapy

## 2021-05-10 DIAGNOSIS — M722 Plantar fascial fibromatosis: Secondary | ICD-10-CM | POA: Diagnosis not present

## 2021-05-10 DIAGNOSIS — M25675 Stiffness of left foot, not elsewhere classified: Secondary | ICD-10-CM

## 2021-05-10 DIAGNOSIS — M79672 Pain in left foot: Secondary | ICD-10-CM | POA: Diagnosis not present
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
## 2021-05-15 ENCOUNTER — Ambulatory Visit: Payer: BC Managed Care – PPO | Admitting: Physical Therapy

## 2021-05-15 ENCOUNTER — Encounter: Payer: Self-pay | Admitting: Physical Therapy

## 2021-05-15 ENCOUNTER — Other Ambulatory Visit: Payer: Self-pay

## 2021-05-15 DIAGNOSIS — M79672 Pain in left foot: Secondary | ICD-10-CM | POA: Diagnosis not present

## 2021-05-15 DIAGNOSIS — M722 Plantar fascial fibromatosis: Secondary | ICD-10-CM | POA: Diagnosis not present

## 2021-05-15 DIAGNOSIS — M25675 Stiffness of left foot, not elsewhere classified: Secondary | ICD-10-CM
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
## 2021-05-17 ENCOUNTER — Other Ambulatory Visit: Payer: Self-pay

## 2021-05-17 ENCOUNTER — Encounter: Payer: Self-pay | Admitting: Physical Therapy

## 2021-05-17 ENCOUNTER — Ambulatory Visit: Payer: BC Managed Care – PPO | Admitting: Physical Therapy

## 2021-05-17 DIAGNOSIS — M25675 Stiffness of left foot, not elsewhere classified: Secondary | ICD-10-CM

## 2021-05-17 DIAGNOSIS — M722 Plantar fascial fibromatosis: Secondary | ICD-10-CM | POA: Diagnosis not present

## 2021-05-17 DIAGNOSIS — M79672 Pain in left foot: Secondary | ICD-10-CM
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
## 2021-05-22 ENCOUNTER — Encounter: Payer: Self-pay | Admitting: Physical Therapy

## 2021-05-22 ENCOUNTER — Other Ambulatory Visit: Payer: Self-pay

## 2021-05-22 ENCOUNTER — Ambulatory Visit: Payer: BC Managed Care – PPO | Admitting: Physical Therapy

## 2021-05-22 DIAGNOSIS — M722 Plantar fascial fibromatosis: Secondary | ICD-10-CM

## 2021-05-22 DIAGNOSIS — M25675 Stiffness of left foot, not elsewhere classified: Secondary | ICD-10-CM

## 2021-05-22 DIAGNOSIS — M79672 Pain in left foot: Secondary | ICD-10-CM | POA: Diagnosis not present
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
## 2021-05-24 ENCOUNTER — Encounter: Payer: BC Managed Care – PPO | Admitting: Physical Therapy

## 2021-05-29 ENCOUNTER — Ambulatory Visit: Payer: BC Managed Care – PPO | Admitting: Physical Therapy

## 2021-05-29 ENCOUNTER — Other Ambulatory Visit: Payer: Self-pay

## 2021-05-29 ENCOUNTER — Encounter: Payer: Self-pay | Admitting: Physical Therapy

## 2021-05-29 DIAGNOSIS — M25675 Stiffness of left foot, not elsewhere classified: Secondary | ICD-10-CM

## 2021-05-29 DIAGNOSIS — M79672 Pain in left foot: Secondary | ICD-10-CM

## 2021-05-29 DIAGNOSIS — M722 Plantar fascial fibromatosis: Secondary | ICD-10-CM

## 2021-05-30 ENCOUNTER — Encounter: Payer: Self-pay | Admitting: Family Medicine

## 2021-05-30 ENCOUNTER — Ambulatory Visit: Payer: BC Managed Care – PPO | Admitting: Family Medicine

## 2021-05-30 VITALS — BP 104/66 | HR 70 | Temp 97.8°F | Ht 73.0 in | Wt 201.0 lb

## 2021-05-30 DIAGNOSIS — K8681 Exocrine pancreatic insufficiency: Secondary | ICD-10-CM

## 2021-05-30 DIAGNOSIS — J4520 Mild intermittent asthma, uncomplicated: Secondary | ICD-10-CM | POA: Diagnosis not present

## 2021-05-30 DIAGNOSIS — M722 Plantar fascial fibromatosis: Secondary | ICD-10-CM | POA: Diagnosis not present

## 2021-05-30 MED ORDER — DICLOFENAC SODIUM 75 MG PO TBEC: 75.0000 mg | DELAYED_RELEASE_TABLET | Freq: Two times a day (BID) | ORAL | 0 refills | Status: DC | PRN

## 2021-05-30 NOTE — Assessment & Plan Note (Signed)
Recent chest x-ray reassuring, that being said prominent and expiratory wheeze noted primarily in the lower lung fields.  He continues to only require his rescue inhaler every several weeks.  Given his overall risks including continued smoking, physical exam findings, I have advised further management with Trelegy Ellipta 100/62.5/25 strength.  2 Rx samples provided to last the duration of 4 weeks.  He will return for reevaluation at that time, if proving beneficial from a subjective and objective standpoint, Rx to be sent in.

## 2021-05-30 NOTE — Assessment & Plan Note (Signed)
>>  ASSESSMENT AND PLAN FOR MILD INTERMITTENT ASTHMA WITHOUT COMPLICATION WRITTEN ON 05/30/2021  9:38 AM BY Harjot Zavadil, Ocie Bob, MD  Recent chest x-ray reassuring, that being said prominent and expiratory wheeze noted primarily in the lower lung fields.  He continues to only require his rescue inhaler every several weeks.  Given his overall risks including continued smoking, physical exam findings, I have advised further management with Trelegy Ellipta 100/62.5/25 strength.  2 Rx samples provided to last the duration of 4 weeks.  He will return for reevaluation at that time, if proving beneficial from a subjective and objective standpoint, Rx to be sent in.

## 2021-05-30 NOTE — Patient Instructions (Signed)
-   Dose Trelegy inhaler daily - Continue albuterol on an as-needed basis for "rescue" - Dose diclofenac daily with food, second dose in evening as-needed - Continue PT, home exercises, braces - Follow-up with GI - Return in 4 weeks, contact for questions
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
## 2021-05-30 NOTE — Assessment & Plan Note (Signed)
Chronic issue that is stable, has been noting benefit with new shoes, physical therapy, compression sock.  Physical examination today does reveal left calcaneal tenderness medially, significant in nature, appears stable.  I did review this with patient given his objective improvement, I have advised scheduled once daily diclofenac with second dose being reserved for as needed dosing.  He will return for reevaluation in 4 weeks, for recalcitrant symptoms further pharmacotherapy advanced versus ultrasound-guided injections to be considered.  He is not amenable to night splints at this time.

## 2021-05-30 NOTE — Assessment & Plan Note (Signed)
Longstanding issue that is actively symptomatic with chronic diarrhea, has seen gastroenterology in the past, referral placed for reestablishment of care from neurology.  We will follow peripherally on this issue.
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
## 2021-05-31 ENCOUNTER — Encounter: Payer: BC Managed Care – PPO | Admitting: Physical Therapy

## 2021-06-05 ENCOUNTER — Other Ambulatory Visit: Payer: Self-pay

## 2021-06-05 ENCOUNTER — Ambulatory Visit: Payer: BC Managed Care – PPO | Attending: Family Medicine | Admitting: Physical Therapy

## 2021-06-05 ENCOUNTER — Encounter: Payer: Self-pay | Admitting: Physical Therapy

## 2021-06-05 DIAGNOSIS — M25675 Stiffness of left foot, not elsewhere classified: Secondary | ICD-10-CM | POA: Diagnosis not present

## 2021-06-05 DIAGNOSIS — M79672 Pain in left foot: Secondary | ICD-10-CM | POA: Diagnosis not present

## 2021-06-05 DIAGNOSIS — M722 Plantar fascial fibromatosis: Secondary | ICD-10-CM | POA: Insufficient documentation
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
## 2021-06-12 ENCOUNTER — Ambulatory Visit: Payer: BC Managed Care – PPO | Admitting: Physical Therapy

## 2021-06-12 ENCOUNTER — Encounter: Payer: Self-pay | Admitting: Physical Therapy

## 2021-06-12 ENCOUNTER — Other Ambulatory Visit: Payer: Self-pay

## 2021-06-12 DIAGNOSIS — M25675 Stiffness of left foot, not elsewhere classified: Secondary | ICD-10-CM | POA: Diagnosis not present

## 2021-06-12 DIAGNOSIS — M722 Plantar fascial fibromatosis: Secondary | ICD-10-CM | POA: Diagnosis not present

## 2021-06-12 DIAGNOSIS — M79672 Pain in left foot: Secondary | ICD-10-CM
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
## 2021-06-19 ENCOUNTER — Ambulatory Visit: Payer: BC Managed Care – PPO | Admitting: Physical Therapy

## 2021-06-19 ENCOUNTER — Encounter: Payer: Self-pay | Admitting: Physical Therapy

## 2021-06-19 ENCOUNTER — Other Ambulatory Visit: Payer: Self-pay

## 2021-06-19 DIAGNOSIS — M79672 Pain in left foot: Secondary | ICD-10-CM | POA: Diagnosis not present

## 2021-06-19 DIAGNOSIS — M722 Plantar fascial fibromatosis: Secondary | ICD-10-CM | POA: Diagnosis not present

## 2021-06-19 DIAGNOSIS — M25675 Stiffness of left foot, not elsewhere classified: Secondary | ICD-10-CM
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
## 2021-06-26 ENCOUNTER — Other Ambulatory Visit: Payer: Self-pay

## 2021-06-26 ENCOUNTER — Encounter: Payer: Self-pay | Admitting: Physical Therapy

## 2021-06-26 ENCOUNTER — Ambulatory Visit: Payer: BC Managed Care – PPO | Admitting: Physical Therapy

## 2021-06-26 DIAGNOSIS — M25675 Stiffness of left foot, not elsewhere classified: Secondary | ICD-10-CM

## 2021-06-26 DIAGNOSIS — M79672 Pain in left foot: Secondary | ICD-10-CM | POA: Diagnosis not present

## 2021-06-26 DIAGNOSIS — M722 Plantar fascial fibromatosis: Secondary | ICD-10-CM | POA: Diagnosis not present
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
## 2021-06-29 ENCOUNTER — Ambulatory Visit: Payer: BC Managed Care – PPO | Admitting: Family Medicine

## 2021-08-03 DIAGNOSIS — L700 Acne vulgaris: Secondary | ICD-10-CM | POA: Diagnosis not present

## 2021-09-04 ENCOUNTER — Other Ambulatory Visit: Payer: Self-pay | Admitting: Family Medicine

## 2021-09-04 ENCOUNTER — Other Ambulatory Visit: Payer: Self-pay

## 2021-09-04 MED ORDER — ALBUTEROL SULFATE HFA 108 (90 BASE) MCG/ACT IN AERS: 2.0000 | INHALATION_SPRAY | Freq: Four times a day (QID) | RESPIRATORY_TRACT | 0 refills | Status: DC | PRN

## 2021-09-04 NOTE — Telephone Encounter (Signed)
Requested medication (s) are due for refill today: yes  Requested medication (s) are on the active medication list:yes  Last refill   06/06/17  1  0  refills  Future visit scheduled no  Notes to clinic: Historical provider   Requested Prescriptions  Pending Prescriptions Disp Refills   albuterol (VENTOLIN HFA) 108 (90 Base) MCG/ACT inhaler 1 each 0    Sig: Inhale 2 puffs into the lungs every 6 (six) hours as needed for wheezing or shortness of breath.     Pulmonology:  Beta Agonists Failed - 09/04/2021  1:58 PM      Failed - One inhaler should last at least one month. If the patient is requesting refills earlier, contact the patient to check for uncontrolled symptoms.      Passed - Valid encounter within last 12 months    Recent Outpatient Visits           3 months ago Mild intermittent asthma without complication   [facility] Jerrol Banana, MD   4 months ago Annual physical exam   [facility] Jerrol Banana, MD   5 months ago Abrasion of right forearm, initial encounter   [facility] Jerrol Banana, MD

## 2021-09-04 NOTE — Telephone Encounter (Signed)
Medication Refill - Medication: Albuterol inhaler  Has the patient contacted their pharmacy? Yes.  Thy told him to call Dr. Ashley Royalty (Agent: If no, request that the patient contact the pharmacy for the refill. If patient does not wish to contact the pharmacy document the reason why and proceed with request.) (Agent: If yes, when and what did the pharmacy advise?)  Preferred Pharmacy (with phone number or street name): [facility] [address] Has the patient been seen for an appointment in the last year OR does the patient have an upcoming appointment? Yes.    Agent: Please be advised that RX refills may take up to 3 business days. We ask that you follow-up with your pharmacy.

## 2021-11-02 DIAGNOSIS — Z792 Long term (current) use of antibiotics: Secondary | ICD-10-CM | POA: Diagnosis not present

## 2021-11-02 DIAGNOSIS — L700 Acne vulgaris: Secondary | ICD-10-CM | POA: Diagnosis not present

## 2022-03-04 ENCOUNTER — Ambulatory Visit: Payer: BC Managed Care – PPO | Admitting: Family Medicine

## 2022-03-04 ENCOUNTER — Encounter: Payer: Self-pay | Admitting: Family Medicine

## 2022-03-04 VITALS — BP 100/60 | HR 65 | Ht 73.0 in | Wt 189.8 lb

## 2022-03-04 DIAGNOSIS — Z8249 Family history of ischemic heart disease and other diseases of the circulatory system: Secondary | ICD-10-CM | POA: Diagnosis not present

## 2022-03-04 DIAGNOSIS — I499 Cardiac arrhythmia, unspecified: Secondary | ICD-10-CM | POA: Diagnosis not present

## 2022-03-04 DIAGNOSIS — M7652 Patellar tendinitis, left knee: Secondary | ICD-10-CM

## 2022-03-04 MED ORDER — CELECOXIB 100 MG PO CAPS: ORAL_CAPSULE | ORAL | 0 refills | Status: DC

## 2022-03-04 NOTE — Patient Instructions (Signed)
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

## 2022-03-04 NOTE — Progress Notes (Signed)
     Primary Care / Sports Medicine Office Visit  Patient Information:  Patient ID: Randall Shelton, male DOB: 11/10/1980 Age: 41 y.o. MRN: [id number]   Randall Shelton is a pleasant 41 y.o. male presenting with the following:  Chief Complaint  Patient presents with   Knee Pain    Left knee has sharp pain when trying to sit on knee, with his line of work it requires this. Bending or moving has no pain, just with floor contact.     Vitals:   03/04/22 0931  BP: 100/60  Pulse: 65  SpO2: 96%   Vitals:   03/04/22 0931  Weight: 189 lb 12.8 oz (86.1 kg)  Height: 6\' 1"  (1.854 m)   Body mass index is 25.04 kg/m.  No results found.   Independent interpretation of notes and tests performed by another provider:   None  Procedures performed:   None  Pertinent History, Exam, Impression, and Recommendations:   Problem List Items Addressed This Visit       Musculoskeletal and Integument   Patellar tendinitis, left knee - Primary    Patient with acute on chronic left greater than night anterior knee pain.  He has a longstanding history of clicking and popping of bilateral knees with pain being noted with increased activity that involves flexion/extension of the knee (stairs, etc.).  He has noted significant exacerbation of symptoms of his left knee and ongoing for roughly a few weeks after having to spend time on his knees while on fiberglass surface with debris.  Since that time has noted significant sharp severe pain anteriorly anytime he puts pressure on his left knee.  Examination shows medial greater than lateral patellar facet tenderness, maximum tenderness that recreates his stated symptomatology along the mid-distal patellar tendon and at the tibial tuberosity, there is evidence of dynamic maltracking with passive flexion/extension, examination otherwise benign.  Plan for patellar strap, home exercises, scheduled Celebrex x2 weeks, and he is to contact for recalcitrant  symptomatology at the 2-week mark or beyond.  At that point x-rays can be considered beneficial to evaluate patellofemoral symptoms, can discuss possible shutdown, increased medications, formal PT.  Chronic condition with acute exacerbation, Rx management       Relevant Medications   celecoxib (CELEBREX) 100 MG capsule     Other   Irregular heart beat    See additional assessment(s) for plan details.       Relevant Orders   Ambulatory referral to Cardiology   Family history of hypertrophic cardiomyopathy    Stated family history of the same, his sister recently underwent surgical correction of this issue and her cardiologist advised all siblings to get checked.  Additionally, he gives a history of irregular heartbeat confirmed by EKG years prior, he is intermittently symptomatic from the same and does not sound further evaluation.       Relevant Orders   Ambulatory referral to Cardiology     Orders & Medications Meds ordered this encounter  Medications   celecoxib (CELEBREX) 100 MG capsule    Sig: Take 1 capsule by mouth once to twice daily with food    Dispense:  30 capsule    Refill:  0   Orders Placed This Encounter  Procedures   Ambulatory referral to Cardiology     Return if symptoms worsen or fail to improve.     [place], MD   Primary Care Sports Medicine [facility]

## 2022-03-04 NOTE — Assessment & Plan Note (Signed)
Patient with acute on chronic left greater than night anterior knee pain.  He has a longstanding history of clicking and popping of bilateral knees with pain being noted with increased activity that involves flexion/extension of the knee (stairs, etc.).  He has noted significant exacerbation of symptoms of his left knee and ongoing for roughly a few weeks after having to spend time on his knees while on fiberglass surface with debris.  Since that time has noted significant sharp severe pain anteriorly anytime he puts pressure on his left knee.  Examination shows medial greater than lateral patellar facet tenderness, maximum tenderness that recreates his stated symptomatology along the mid-distal patellar tendon and at the tibial tuberosity, there is evidence of dynamic maltracking with passive flexion/extension, examination otherwise benign.  Plan for patellar strap, home exercises, scheduled Celebrex x2 weeks, and he is to contact us for recalcitrant symptomatology at the 2-week mark or beyond.  At that point x-rays can be considered beneficial to evaluate patellofemoral symptoms, can discuss possible shutdown, increased medications, formal PT.  Chronic condition with acute exacerbation, Rx management

## 2022-03-04 NOTE — Assessment & Plan Note (Signed)
See additional assessment(s) for plan details. 

## 2022-03-04 NOTE — Assessment & Plan Note (Signed)
Stated family history of the same, his sister recently underwent surgical correction of this issue and her cardiologist advised all siblings to get checked.  Additionally, he gives a history of irregular heartbeat confirmed by EKG years prior, he is intermittently symptomatic from the same and does not sound further evaluation.

## 2022-03-12 NOTE — Progress Notes (Unsigned)
Cardiology Office Note  Date:  03/13/2022   ID:  ESTELLE SKIBICKI, DOB Jun 06, 1981, MRN [id number]  PCP:  Jerrol Banana, MD   Chief Complaint  Patient presents with   Other    Fam hx hypertrophic cardiomyopathy/irregular heartbeat. Meds reviewed verbally with pt.    HPI:  Mr. ladarian bonczek is a 41 year old gentleman with past medical history of Family history hypertrophic cardiomyopathy Who presents by referral from Dr. Joseph Berkshire for consultation of his family history of hypertrophic cardiomyopathy, irregular heartbeat   sister recently underwent surgical correction of this issue  Myomectomy possibly  He reports that he has rare SOB, possibly from asthma, some episodes of  lightheadedness Discomfort in chest at times  Uses his watch to record extra beats, PACs  has noticed some irregularities  Active , 8 K steps today Knee issue  EKG personally reviewed by myself on todays visit NSR rate 58 bpm  no ST or T wave changes   PMH:   has a past medical history of Asthma, Chronic back pain, Irregular heart beat, Pancreatitis, and Proctocolitis.  PSH:    Past Surgical History:  Procedure Laterality Date   INCISION AND DRAINAGE ABSCESS N/A 08/18/2020   Procedure: INCISION AND DRAINAGE ABSCESS, perirectal abscess;  Surgeon: Campbell Lerner, MD;  Location: [facility] ORS;  Service: General;  Laterality: N/A;   WISDOM TOOTH EXTRACTION Bilateral 2002    Current Outpatient Medications  Medication Sig Dispense Refill   albuterol (VENTOLIN HFA) 108 (90 Base) MCG/ACT inhaler Inhale 2 puffs into the lungs every 6 (six) hours as needed for wheezing or shortness of breath. 1 each 0   celecoxib (CELEBREX) 100 MG capsule Take 1 capsule by mouth once to twice daily with food 30 capsule 0   No current facility-administered medications for this visit.     Allergies:   Patient has no known allergies.   Social History:  The patient  reports that he has been smoking cigarettes. He has a 25.00  pack-year smoking history. He has never used smokeless tobacco. He reports current alcohol use. He reports current drug use. Drugs: Marijuana and Cocaine.   Family History:   family history includes Bone cancer in his father; COPD in his mother; Cancer in his maternal grandfather; Diabetes Mellitus II in his maternal grandmother; Diverticulitis in his father; Heart attack in his paternal grandmother; Heart disease in his father and sister; Heart failure in his father; Hiatal hernia in his mother; High Cholesterol in his son; Hypertension in his father; Pneumonia in his maternal grandmother and paternal grandfather; Seizures in his sister.    Review of Systems: Review of Systems  Constitutional: Negative.   HENT: Negative.    Respiratory:  Positive for shortness of breath.   Cardiovascular:  Positive for chest pain and palpitations.  Gastrointestinal: Negative.   Musculoskeletal: Negative.   Neurological: Negative.   Psychiatric/Behavioral: Negative.    All other systems reviewed and are negative.    PHYSICAL EXAM: VS:  BP 102/70 (BP Location: Right Arm, Patient Position: Sitting, Cuff Size: Normal)   Pulse (!) 58   Ht 6\' 1"  (1.854 m)   Wt 193 lb 6 oz (87.7 kg)   BMI 25.51 kg/m  , BMI Body mass index is 25.51 kg/m. GEN: Well nourished, well developed, in no acute distress HEENT: normal Neck: no JVD, carotid bruits, or masses Cardiac: RRR; no murmurs, rubs, or gallops,no edema  Respiratory:  clear to auscultation bilaterally, normal work of breathing GI: soft, nontender, nondistended, +  BS MS: no deformity or atrophy Skin: warm and dry, no rash Neuro:  Strength and sensation are intact Psych: euthymic mood, full affect   Recent Labs: 05/07/2021: ALT 11; BUN 13; Creatinine, Ser 1.15; Hemoglobin 14.0; Platelets 205; Potassium 4.1; Sodium 136    Lipid Panel Lab Results  Component Value Date   CHOL 170 05/07/2021   HDL 30 (L) 05/07/2021   LDLCALC 111 (H) 05/07/2021   TRIG 143  05/07/2021      Wt Readings from Last 3 Encounters:  03/13/22 193 lb 6 oz (87.7 kg)  03/04/22 189 lb 12.8 oz (86.1 kg)  05/30/21 201 lb (91.2 kg)      ASSESSMENT AND PLAN:  Problem List Items Addressed This Visit     Irregular heart beat   Relevant Orders   EKG 12-Lead   Family history of hypertrophic cardiomyopathy - Primary   Relevant Orders   EKG 12-Lead   Family history of hypertrophic cardiomyopathy Sister was diagnosed, had what sounds like myomectomy and pacer I have ordered echocardiogram to rule out familial process  Palpitations Likely PACs, able to capture them on his Apple Watch Zio monitor ordered for further evaluation  Chest pain Echocardiogram pending Discussed screening study with CT coronary calcium scoring He will call us if interested  Smoker We have encouraged him to continue to work on weaning his cigarettes and smoking cessation. He will continue to work on this  Consider Chantix   Total encounter time more than 60 minutes  Greater than 50% was spent in counseling and coordination of care with the patient    Signed, Dossie Arbour, M.D., Ph.D. [facility] [place], [place] [phone]

## 2022-03-13 ENCOUNTER — Ambulatory Visit: Payer: BC Managed Care – PPO | Admitting: Cardiovascular Disease

## 2022-03-13 ENCOUNTER — Ambulatory Visit (INDEPENDENT_AMBULATORY_CARE_PROVIDER_SITE_OTHER): Payer: BC Managed Care – PPO

## 2022-03-13 ENCOUNTER — Encounter: Payer: Self-pay | Admitting: Cardiovascular Disease

## 2022-03-13 VITALS — BP 102/70 | HR 58 | Ht 73.0 in | Wt 193.4 lb

## 2022-03-13 DIAGNOSIS — R002 Palpitations: Secondary | ICD-10-CM

## 2022-03-13 DIAGNOSIS — I499 Cardiac arrhythmia, unspecified: Secondary | ICD-10-CM

## 2022-03-13 DIAGNOSIS — R079 Chest pain, unspecified: Secondary | ICD-10-CM

## 2022-03-13 DIAGNOSIS — Z8249 Family history of ischemic heart disease and other diseases of the circulatory system: Secondary | ICD-10-CM | POA: Diagnosis not present

## 2022-03-13 NOTE — Patient Instructions (Addendum)
Echocardiogram for family of hypertrophic cardiomyopathy,  Chest pain, palpitations  Zio monitor for palpitations  Read about CT coronary calcium score, $99  Medication Instructions:  No changes  If you need a refill on your cardiac medications before your next appointment, please call your pharmacy.   Lab work: No new labs needed  Testing/Procedures: No new testing needed  Follow-Up: At [facility], you and your health needs are our priority.  As part of our continuing mission to provide you with exceptional heart care, we have created designated Provider Care Teams.  These Care Teams include your primary Cardiologist (physician) and Advanced Practice Providers (APPs -  Physician Assistants and Nurse Practitioners) who all work together to provide you with the care you need, when you need it.  You will need a follow up appointment as needed  Providers on your designated Care Team:   Nicolasa Ducking, NP Eula Listen, PA-C Cadence Fransico Michael, [place]  COVID-19 Vaccine Information can be found at: [email or web address] For questions related to vaccine distribution or appointments, please email [email or web address] or call [phone].    Your provider has ordered a heart monitor to wear for 14 days. This will be mailed to your home with instructions on placement. Once you have finished the time frame requested, you will return monitor in box provided.

## 2022-03-15 ENCOUNTER — Other Ambulatory Visit: Payer: BC Managed Care – PPO

## 2022-03-21 ENCOUNTER — Telehealth: Payer: Self-pay | Admitting: Cardiovascular Disease

## 2022-03-21 NOTE — Telephone Encounter (Signed)
Patient would like to find out the status of the device that is to be ordered for him.

## 2022-03-26 DIAGNOSIS — I499 Cardiac arrhythmia, unspecified: Secondary | ICD-10-CM | POA: Diagnosis not present

## 2022-03-26 DIAGNOSIS — R002 Palpitations: Secondary | ICD-10-CM | POA: Diagnosis not present

## 2022-04-15 DIAGNOSIS — R002 Palpitations: Secondary | ICD-10-CM | POA: Diagnosis not present

## 2022-04-15 DIAGNOSIS — I499 Cardiac arrhythmia, unspecified: Secondary | ICD-10-CM | POA: Diagnosis not present

## 2022-04-16 ENCOUNTER — Ambulatory Visit (INDEPENDENT_AMBULATORY_CARE_PROVIDER_SITE_OTHER): Payer: BC Managed Care – PPO

## 2022-04-16 DIAGNOSIS — R079 Chest pain, unspecified: Secondary | ICD-10-CM

## 2022-04-16 DIAGNOSIS — Z8249 Family history of ischemic heart disease and other diseases of the circulatory system: Secondary | ICD-10-CM | POA: Diagnosis not present

## 2022-04-16 LAB — ECHOCARDIOGRAM COMPLETE
AR max vel: 2.62 cm2
AV Area VTI: 2.37 cm2
AV Area mean vel: 2.35 cm2
AV Mean grad: 4 mmHg
AV Peak grad: 5.9 mmHg
Ao pk vel: 1.21 m/s
Area-P 1/2: 3.45 cm2
Calc EF: 70.8 %
S' Lateral: 3.2 cm
Single Plane A2C EF: 72.8 %
Single Plane A4C EF: 67.8 %

## 2022-04-30 ENCOUNTER — Telehealth: Payer: Self-pay | Admitting: Emergency Medicine

## 2022-04-30 NOTE — Telephone Encounter (Signed)
-----   Message from Antonieta Iba, MD sent at 04/30/2022  8:51 AM EDT ----- Event monitor/zio Overall no significant arrhythmia, not an overwhelming amount of extra beats noted Looked pretty good in general

## 2022-04-30 NOTE — Telephone Encounter (Signed)
Called and spoke with patient. Results reviewed with patient, pt verbalized understanding,  questions (if any) answered.   ?

## 2022-10-01 ENCOUNTER — Ambulatory Visit: Payer: BC Managed Care – PPO | Admitting: Family Medicine

## 2022-10-01 ENCOUNTER — Encounter: Payer: Self-pay | Admitting: Family Medicine

## 2022-10-01 VITALS — BP 102/78 | HR 74 | Temp 98.7°F | Ht 73.0 in | Wt 203.0 lb

## 2022-10-01 DIAGNOSIS — J4520 Mild intermittent asthma, uncomplicated: Secondary | ICD-10-CM | POA: Diagnosis not present

## 2022-10-01 DIAGNOSIS — J019 Acute sinusitis, unspecified: Secondary | ICD-10-CM | POA: Diagnosis not present

## 2022-10-01 MED ORDER — PROMETHAZINE-DM 6.25-15 MG/5ML PO SYRP: 5.0000 mL | ORAL_SOLUTION | Freq: Four times a day (QID) | ORAL | 0 refills | Status: DC | PRN

## 2022-10-01 MED ORDER — ALBUTEROL SULFATE HFA 108 (90 BASE) MCG/ACT IN AERS: 2.0000 | INHALATION_SPRAY | Freq: Four times a day (QID) | RESPIRATORY_TRACT | 2 refills | Status: DC | PRN

## 2022-10-01 MED ORDER — AZITHROMYCIN 250 MG PO TABS: ORAL_TABLET | ORAL | 0 refills | Status: AC

## 2022-10-06 NOTE — Assessment & Plan Note (Signed)
Several day history of congestion, facial pressure, mild SOA in setting of asthma. Exam with swollen, erythematous turbinates, minimally tender sinuses, benign oropharynx, TMs and canals. See additional assessment(s) for plan details. Plan for azithromycin course and Rx antitussive.

## 2022-10-06 NOTE — Assessment & Plan Note (Signed)
>>  ASSESSMENT AND PLAN FOR MILD INTERMITTENT ASTHMA WITHOUT COMPLICATION WRITTEN ON 10/06/2022 10:39 PM BY Nicholas Trompeter, Ocie Bob, MD  Chronic condition, exacerbation concern. Exam with mild expiratory wheeze, otherwise reassuring. Plan for Rx albuterol and advised on dosing. See additional assessment(s) for plan details.

## 2022-10-06 NOTE — Progress Notes (Signed)
     Primary Care / Sports Medicine Office Visit  Patient Information:  Patient ID: Randall LOPEZGARCIA, male DOB: 03/16/1981 Age: 42 y.o. MRN: [id number]   Randall Shelton is a pleasant 42 y.o. male presenting with the following:  Chief Complaint  Patient presents with   URI    6 days, sinus pressure, cough not really productive. Has been taken mucinex     Vitals:   10/01/22 1617  BP: 102/78  Pulse: 74  Temp: 98.7 F (37.1 C)  SpO2: 96%   Vitals:   10/01/22 1617  Weight: 203 lb (92.1 kg)  Height: 6\' 1"  (1.854 m)   Body mass index is 26.78 kg/m.  No results found.   Independent interpretation of notes and tests performed by another provider:   None  Procedures performed:   None  Pertinent History, Exam, Impression, and Recommendations:   Randall Shelton was seen today for uri.  Acute non-recurrent sinusitis, unspecified location Assessment & Plan: Several day history of congestion, facial pressure, mild SOA in setting of asthma. Exam with swollen, erythematous turbinates, minimally tender sinuses, benign oropharynx, TMs and canals. See additional assessment(s) for plan details. Plan for azithromycin course and Rx antitussive.  Orders: -     Azithromycin; Take 2 tablets on day 1, then 1 tablet daily on days 2 through 5  Dispense: 6 tablet; Refill: 0 -     Promethazine-DM; Take 5 mLs by mouth 4 (four) times daily as needed for cough.  Dispense: 118 mL; Refill: 0  Mild intermittent asthma without complication Assessment & Plan: Chronic condition, exacerbation concern. Exam with mild expiratory wheeze, otherwise reassuring. Plan for Rx albuterol and advised on dosing. See additional assessment(s) for plan details.  Orders: -     Albuterol Sulfate HFA; Inhale 2 puffs into the lungs every 6 (six) hours as needed for wheezing or shortness of breath.  Dispense: 2 each; Refill: 2     Orders & Medications Meds ordered this encounter  Medications   azithromycin (ZITHROMAX) 250 MG  tablet    Sig: Take 2 tablets on day 1, then 1 tablet daily on days 2 through 5    Dispense:  6 tablet    Refill:  0   albuterol (VENTOLIN HFA) 108 (90 Base) MCG/ACT inhaler    Sig: Inhale 2 puffs into the lungs every 6 (six) hours as needed for wheezing or shortness of breath.    Dispense:  2 each    Refill:  2   promethazine-dextromethorphan (PROMETHAZINE-DM) 6.25-15 MG/5ML syrup    Sig: Take 5 mLs by mouth 4 (four) times daily as needed for cough.    Dispense:  118 mL    Refill:  0   No orders of the defined types were placed in this encounter.    Return in about 4 weeks (around 10/29/2022) for CPE.     Montel Culver, MD, [facility]   Primary Care Sports Medicine Primary Care and Sports Medicine at [facility]

## 2022-10-06 NOTE — Assessment & Plan Note (Signed)
Chronic condition, exacerbation concern. Exam with mild expiratory wheeze, otherwise reassuring. Plan for Rx albuterol and advised on dosing. See additional assessment(s) for plan details.

## 2022-10-30 ENCOUNTER — Ambulatory Visit (INDEPENDENT_AMBULATORY_CARE_PROVIDER_SITE_OTHER): Payer: BC Managed Care – PPO | Admitting: Family Medicine

## 2022-10-30 ENCOUNTER — Ambulatory Visit
Admission: RE | Admit: 2022-10-30 | Discharge: 2022-10-30 | Disposition: A | Discharge status: Home / Self Care | Payer: BC Managed Care – PPO | Source: Ambulatory Visit | Attending: Family Medicine | Admitting: Family Medicine

## 2022-10-30 ENCOUNTER — Ambulatory Visit
Admission: RE | Admit: 2022-10-30 | Discharge: 2022-10-30 | Disposition: A | Discharge status: Home / Self Care | Payer: BC Managed Care – PPO | Attending: Family Medicine | Admitting: Family Medicine

## 2022-10-30 ENCOUNTER — Encounter: Payer: Self-pay | Admitting: Family Medicine

## 2022-10-30 VITALS — BP 108/60 | HR 88 | Ht 73.0 in | Wt 202.0 lb

## 2022-10-30 DIAGNOSIS — K8681 Exocrine pancreatic insufficiency: Secondary | ICD-10-CM

## 2022-10-30 DIAGNOSIS — J4520 Mild intermittent asthma, uncomplicated: Secondary | ICD-10-CM

## 2022-10-30 DIAGNOSIS — R059 Cough, unspecified: Secondary | ICD-10-CM | POA: Diagnosis not present

## 2022-10-30 DIAGNOSIS — Z720 Tobacco use: Secondary | ICD-10-CM | POA: Diagnosis not present

## 2022-10-30 DIAGNOSIS — Z0000 Encounter for general adult medical examination without abnormal findings: Secondary | ICD-10-CM | POA: Diagnosis not present

## 2022-10-30 DIAGNOSIS — R911 Solitary pulmonary nodule: Secondary | ICD-10-CM

## 2022-10-30 MED ORDER — ALBUTEROL SULFATE HFA 108 (90 BASE) MCG/ACT IN AERS: 2.0000 | INHALATION_SPRAY | Freq: Four times a day (QID) | RESPIRATORY_TRACT | 2 refills | Status: DC | PRN

## 2022-10-30 MED ORDER — BUPROPION HCL ER (SR) 150 MG PO TB12: ORAL_TABLET | ORAL | 0 refills | Status: DC

## 2022-10-30 NOTE — Progress Notes (Signed)
Annual Physical Exam Visit  Patient Information:  Patient ID: Randall Shelton, male DOB: 1981-07-03 Age: 42 y.o. MRN: [id number]   Subjective:   CC: Annual Physical Exam  HPI:  Randall Shelton is here for their annual physical.  I reviewed the past medical history, family history, social history, surgical history, and allergies today and changes were made as necessary.  Please see the problem list section below for additional details.  Past Medical History: Past Medical History:  Diagnosis Date   Asthma    Chronic back pain    Irregular heart beat    Pancreatitis    Proctocolitis    Past Surgical History: Past Surgical History:  Procedure Laterality Date   INCISION AND DRAINAGE ABSCESS N/A 08/18/2020   Procedure: INCISION AND DRAINAGE ABSCESS, perirectal abscess;  Surgeon: Ronny Bacon, MD;  Location: [facility] ORS;  Service: General;  Laterality: N/A;   WISDOM TOOTH EXTRACTION Bilateral 2002   Family History: Family History  Problem Relation Age of Onset   COPD Mother        Smoker   Hiatal hernia Mother    Heart failure Father    Heart disease Father    Hypertension Father    Diverticulitis Father    Bone cancer Father    Seizures Sister    Heart disease Sister    Diabetes Mellitus II Maternal Grandmother    Pneumonia Maternal Grandmother    Cancer Maternal Grandfather    Heart attack Paternal Grandmother    Pneumonia Paternal Grandfather    High Cholesterol Son    Allergies: No Known Allergies Health Maintenance: Health Maintenance  Topic Date Due   INFLUENZA VACCINE  12/29/2022 (Originally 04/30/2022)   DTaP/Tdap/Td (2 - Td or Tdap) 04/05/2031   Hepatitis C Screening  Completed   HIV Screening  Completed   HPV VACCINES  Aged Out   COVID-19 Vaccine  Discontinued    HM Colonoscopy     This patient has no relevant Health Maintenance data.      Medications: No current outpatient medications on file prior to visit.   No current facility-administered  medications on file prior to visit.    Review of Systems: No headache, visual changes, nausea, vomiting, +diarrhea, dizziness, abdominal pain, skin rash, fevers, chills, night sweats, swollen lymph nodes, weight loss, chest pain, body aches, joint swelling, muscle aches, shortness of breath, mood changes, visual or auditory hallucinations reported.  Objective:   Vitals:   10/30/22 0816  BP: 108/60  Pulse: 88  SpO2: 99%   Vitals:   10/30/22 0816  Weight: 202 lb (91.6 kg)  Height: 6\' 1"  (1.854 m)   Body mass index is 26.65 kg/m.  General: Well Developed, well nourished, and in no acute distress.  Neuro: Alert and oriented x3, extra-ocular muscles intact, sensation grossly intact. Cranial nerves II through XII are grossly intact, motor, sensory, and coordinative functions are intact. HEENT: Normocephalic, atraumatic, pupils equal round reactive to light, neck supple, no masses, no lymphadenopathy, thyroid nonpalpable. Oropharynx, nasopharynx, external ear canals are unremarkable. Skin: Warm and dry, no rashes noted.  Cardiac: Regular rate and rhythm, no murmurs rubs or gallops. No peripheral edema. Pulses symmetric. Respiratory: Clear to auscultation bilaterally. Not using accessory muscles, speaking in full sentences.  Abdominal: Soft, minimally tender left lower quadrant, nondistended, positive normoactive bowel sounds, no masses, no organomegaly. Musculoskeletal: Shoulder, elbow, wrist, hip, knee, ankle stable, and with full range of motion.   Impression and Recommendations:   The patient  was counselled, risk factors were discussed, and anticipatory guidance given.  Problem List Items Addressed This Visit       Respiratory   Mild intermittent asthma without complication    Overall well control, needing albuterol infrequently; interested in comorbid allergy testing, referral placed.      Relevant Medications   albuterol (VENTOLIN HFA) 108 (90 Base) MCG/ACT inhaler   Other  Relevant Orders   Ambulatory referral to Allergy     Digestive   Exocrine pancreatic insufficiency    In the setting of chronic diarrhea, previous diagnosis of pancreatic insufficiency and previously prescribed enzyme replacement. Making dietary changes but still symptomatic. Labs ordered, referral to GI placed.      Relevant Orders   Comprehensive metabolic panel   Lipid panel   Amylase   Lipase   Ambulatory referral to Gastroenterology     Other   Tobacco abuse    Chronic condition, reviewed cessation options, is amenable to pharmacotherapy.  Wellbutrin prescribed, will follow in 3 months.      Relevant Medications   albuterol (VENTOLIN HFA) 108 (90 Base) MCG/ACT inhaler   buPROPion (WELLBUTRIN SR) 150 MG 12 hr tablet   Incidental lung nodule    Reported to have been noted roughly 10 years prior during abdominal imaging, asymptomatic other than comorbid asthma.  Chest x-ray ordered, will follow results.      Relevant Orders   DG Chest 2 View (Completed)   Annual physical exam - Primary    Annual examination completed, risk stratification labs ordered, anticipatory guidance provided.  We will follow labs once resulted.      Relevant Orders   CBC   Comprehensive metabolic panel   Lipid panel   TSH   Amylase   Lipase     Orders & Medications Medications:  Meds ordered this encounter  Medications   albuterol (VENTOLIN HFA) 108 (90 Base) MCG/ACT inhaler    Sig: Inhale 2 puffs into the lungs every 6 (six) hours as needed for wheezing or shortness of breath.    Dispense:  2 each    Refill:  2   buPROPion (WELLBUTRIN SR) 150 MG 12 hr tablet    Sig: Take 150 mg PO once daily for 3 days then increase to 150 mg PO twice daily. Plan to stop smoking 7 days after starting medication.    Dispense:  180 tablet    Refill:  0   Orders Placed This Encounter  Procedures   DG Chest 2 View   CBC   Comprehensive metabolic panel   Lipid panel   TSH   Amylase   Lipase    Ambulatory referral to Allergy   Ambulatory referral to Gastroenterology     Return in about 3 months (around 01/28/2023) for f/u specialists, smoking cessation.    Montel Culver, MD, [facility]   Primary Care Sports Medicine Primary Care and Sports Medicine at [facility]

## 2022-10-30 NOTE — Assessment & Plan Note (Signed)
In the setting of chronic diarrhea, previous diagnosis of pancreatic insufficiency and previously prescribed enzyme replacement. Making dietary changes but still symptomatic. Labs ordered, referral to GI placed.

## 2022-10-30 NOTE — Assessment & Plan Note (Signed)
Annual examination completed, risk stratification labs ordered, anticipatory guidance provided.  We will follow labs once resulted. 

## 2022-10-30 NOTE — Patient Instructions (Signed)
-  Obtain fasting labs with orders provided (can have water or black coffee but otherwise no food or drink x 8 hours before labs) - Review information provided - Attend eye doctor annually, dentist every 6 months, work towards or maintain 30 minutes of moderate intensity physical activity at least 5 days per week, and consume a balanced diet - Return in 3 months for follow-up - Contact us for any questions between now and then

## 2022-10-30 NOTE — Assessment & Plan Note (Signed)
Overall well control, needing albuterol infrequently; interested in comorbid allergy testing, referral placed.

## 2022-10-30 NOTE — Assessment & Plan Note (Signed)
>>  ASSESSMENT AND PLAN FOR MILD INTERMITTENT ASTHMA WITHOUT COMPLICATION WRITTEN ON 10/30/2022  8:58 AM BY Xavior Niazi, Ocie Bob, MD  Overall well control, needing albuterol infrequently; interested in comorbid allergy testing, referral placed.

## 2022-10-30 NOTE — Assessment & Plan Note (Signed)
Reported to have been noted roughly 10 years prior during abdominal imaging, asymptomatic other than comorbid asthma.  Chest x-ray ordered, will follow results.

## 2022-10-30 NOTE — Assessment & Plan Note (Signed)
Chronic condition, reviewed cessation options, is amenable to pharmacotherapy.  Wellbutrin prescribed, will follow in 3 months.

## 2022-10-31 LAB — LIPID PANEL
Chol/HDL Ratio: 5.8 ratio — ABNORMAL HIGH (ref 0.0–5.0)
Cholesterol, Total: 163 mg/dL (ref 100–199)
HDL: 28 mg/dL — ABNORMAL LOW (ref >39)
LDL Chol Calc (NIH): 104 mg/dL — ABNORMAL HIGH (ref 0–99)
Triglycerides: 175 mg/dL — ABNORMAL HIGH (ref 0–149)
VLDL Cholesterol Cal: 31 mg/dL (ref 5–40)

## 2022-10-31 LAB — COMPREHENSIVE METABOLIC PANEL
ALT: 11 IU/L (ref 0–44)
AST: 13 IU/L (ref 0–40)
Albumin/Globulin Ratio: 1.8 (ref 1.2–2.2)
Albumin: 4.3 g/dL (ref 4.1–5.1)
Alkaline Phosphatase: 67 IU/L (ref 44–121)
BUN/Creatinine Ratio: 13 (ref 9–20)
BUN: 14 mg/dL (ref 6–24)
Bilirubin Total: 0.4 mg/dL (ref 0.0–1.2)
CO2: 20 mmol/L (ref 20–29)
Calcium: 9.3 mg/dL (ref 8.7–10.2)
Chloride: 104 mmol/L (ref 96–106)
Creatinine, Ser: 1.08 mg/dL (ref 0.76–1.27)
Globulin, Total: 2.4 g/dL (ref 1.5–4.5)
Glucose: 89 mg/dL (ref 70–99)
Potassium: 4.1 mmol/L (ref 3.5–5.2)
Sodium: 141 mmol/L (ref 134–144)
Total Protein: 6.7 g/dL (ref 6.0–8.5)
eGFR: 88 mL/min/{1.73_m2} (ref >59)

## 2022-10-31 LAB — TSH: TSH: 1.51 u[IU]/mL (ref 0.450–4.500)

## 2022-10-31 LAB — CBC
Hematocrit: 41.6 % (ref 37.5–51.0)
Hemoglobin: 14.1 g/dL (ref 13.0–17.7)
MCH: 28.9 pg (ref 26.6–33.0)
MCHC: 33.9 g/dL (ref 31.5–35.7)
MCV: 85 fL (ref 79–97)
Platelets: 227 10*3/uL (ref 150–450)
RBC: 4.88 x10E6/uL (ref 4.14–5.80)
RDW: 12.9 % (ref 11.6–15.4)
WBC: 10 10*3/uL (ref 3.4–10.8)

## 2022-10-31 LAB — AMYLASE: Amylase: 70 U/L (ref 31–110)

## 2022-10-31 LAB — LIPASE: Lipase: 18 U/L (ref 13–78)

## 2022-11-26 ENCOUNTER — Ambulatory Visit: Payer: Self-pay | Admitting: *Deleted

## 2022-11-26 NOTE — Telephone Encounter (Signed)
Please review.  KP

## 2022-11-26 NOTE — Telephone Encounter (Signed)
Reason for Disposition  Widespread hives  Answer Assessment - Initial Assessment Questions 1. APPEARANCE: "What does the rash look like?"      Broke out in hives on Sat. Afternoon.   Around my waist and upper thighs.  Sunday thing.   Itching all over.  L Monday they were better but still itching.    I did not take the medicine.    Wellbutrin.    I've been taken the Wellbutrin for 20 days.   Sunday I was having chest pain.    It would bother me after eating.    It's gone.    I'm still itching all over my body.   2. LOCATION: "Where is the rash located?"      See above 3. NUMBER: "How many hives are there?"      Not asked For stopping smoking is why I'm on it. 4. SIZE: "How big are the hives?" (inches, cm, compare to coins) "Do they all look the same or is there lots of variation in shape and size?"      They itch.  The blister looking hives are better today but I'm still itching real bad all over. 5. ONSET: "When did the hives begin?" (Hours or days ago)      Sat. afternoon 6. ITCHING: "Does it itch?" If Yes, ask: "How bad is the itch?"    - MILD: doesn't interfere with normal activities   - MODERATE-SEVERE: interferes with work, school, sleep, or other activities      Yes itching bad all over 7. RECURRENT PROBLEM: "Have you had hives before?" If Yes, ask: "When was the last time?" and "What happened that time?"      Not asked 8. TRIGGERS: "Were you exposed to any new food, plant, cosmetic product or animal just before the hives began?"     Started on Wellbutrin 20 days ago to stop smoking.   That's the only new thing I'm on. 9. OTHER SYMPTOMS: "Do you have any other symptoms?" (e.g., fever, tongue swelling, difficulty breathing, abdomen pain)     Denies swelling of throat, tongue, lips, no shortness of breath no chest pain since Sat that one occasion.    10. PREGNANCY: "Is there any chance you are pregnant?" "When was your last menstrual period?"       N/A  Protocols used: Hives-A-AH

## 2022-11-26 NOTE — Telephone Encounter (Signed)
  Chief Complaint: Hives and itching from taking Wellbutrin for 20 days to stop smoking. Symptoms: Has hives around his waist and upper thighs.   He is itching all over his body.   Has not tried any Benadryl or antihistamine.   He was calling in to see if he should take Benadryl.  Frequency: Broke out in hives Sat. Afternoon. Pertinent Negatives: Patient denies shortness of breath, or chest pain outside of one episode of chest pain Sunday that bothered him after eating but has not occurred since. Disposition: []$ ED /[]$ Urgent Care (no appt availability in office) / []$ Appointment(In office/virtual)/ []$  [facility]/ [x]$ Home Care/ []$ Refused Recommended Disposition /[]$ [facility]/ []$  Follow-up with PCP Additional Notes: Went over the Care Advice.   He was agreeable to getting the Benadryl and trying it first.   Warned him about it possibly making him sleepy.     I have sent a high priority message to Dr. Rosette Reveal.    Pt is going to hold off on taking any more of the Wellbutrin until he has heard from Dr. Zigmund Daniel.   He is on it to stop smoking.     Went over the s/s to go to the ED if they occur.   He verbalized understanding.

## 2022-11-27 NOTE — Telephone Encounter (Signed)
The rash has gone away. Pt will start wellbutrin.  KP

## 2022-12-03 NOTE — Progress Notes (Unsigned)
NEW PATIENT Date of Service/Encounter:  12/05/22 Referring provider: Montel Culver, MD Primary care provider: Montel Culver, MD  Subjective:  Randall Shelton is a 42 y.o. male with a PMHx of pancreatic insufficency, intermittent asthma presenting today for evaluation of "allergies". History obtained from: chart review and patient.   Asthma:  Diagnosed in childhood. He is not using any controller medications. He does have rescue inhaler which he uses infrequently.  Typically will use at change of season and will use frequently during this period.  Occasionally a chemical, scent or fragrance will cause him to have throat tightening and he will use it then.  Works in [employer]. Hospitalized multiple times in childhood for asthma, none as an adult. He has not been on a daily inhaler since early childhood. He has needed once per year systemic steroids for several years during season change from fall to winter for URI with trouble breathing. He will usually also get prescribed antibiotics during this period as well. He is a smoker 0.5 ppd x 26 years - has been up to 1.5 to 2 ppd. Triggers: URI, irritants  Chronic rhinitis:  He does have a lot of sinus drainage.  Winter is worse for him. Dry air. No previous allergy testing.  He has had reflux, but no longer an issue. Will occasionally take TUMS or rolaids-maybe once per week. Depends on his diet.  No prior ENT surgeries PCP visit 10/30/22-intermittent asthma, interested in allergy testing. Referral placed.  Food allergy:  Beer and crayfish- He ate crayfish which made him feel dizzy and almost like he was drunk. He eats crab, shrimp and lobster without issues.  Beer-causes cold sweats and vomiting. Malt liquor does the same, other liquors he is fine.  He had similar symptoms with champagne once.  Happening with domestic beers. This happens with half a beer.  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  He was on enzyme  replacement but this was unaffordable at this time. He has found ways to work around it with his diet.  He is not sure if he has been tested for cystic fibrosis.  He is no longer following with a GI doctor.  He was recently referred to a new doctor.    Past Medical History: Past Medical History:  Diagnosis Date   Asthma    Chronic back pain    Irregular heart beat    Pancreatitis    Proctocolitis    Medication List:  Current Outpatient Medications  Medication Sig Dispense Refill   albuterol (VENTOLIN HFA) 108 (90 Base) MCG/ACT inhaler Inhale 2 puffs into the lungs every 6 (six) hours as needed for wheezing or shortness of breath. 2 each 2   Albuterol-Budesonide (AIRSUPRA) 90-80 MCG/ACT AERO Inhale 2 Inhalations into the lungs as needed. Maximum 12 puffs/day. 10.7 g 3   budesonide-formoterol (SYMBICORT) 80-4.5 MCG/ACT inhaler Inhale 2 puffs into the lungs in the morning and at bedtime. Start at the onset of upper respiratory illness/asthma flare and use 2 puffs twice daily for 2 weeks or until symptoms resolve. 1 each 5   buPROPion (WELLBUTRIN SR) 150 MG 12 hr tablet Take 150 mg PO once daily for 3 days then increase to 150 mg PO twice daily. Plan to stop smoking 7 days after starting medication. 180 tablet 0   EPINEPHrine (EPIPEN 2-PAK) 0.3 mg/0.3 mL IJ SOAJ injection Inject 0.3 mg into the muscle as needed for anaphylaxis. 2 each 2   No current facility-administered medications for this  visit.   Known Allergies:  Not on File Past Surgical History: Past Surgical History:  Procedure Laterality Date   INCISION AND DRAINAGE ABSCESS N/A 08/18/2020   Procedure: INCISION AND DRAINAGE ABSCESS, perirectal abscess;  Surgeon: Ronny Bacon, MD;  Location: [facility] ORS;  Service: General;  Laterality: N/A;   WISDOM TOOTH EXTRACTION Bilateral 2002   Family History: Family History  Problem Relation Age of Onset   COPD Mother        Smoker   Hiatal hernia Mother    Heart failure Father     Heart disease Father    Hypertension Father    Diverticulitis Father    Bone cancer Father    Seizures Sister    Heart disease Sister    Diabetes Mellitus II Maternal Grandmother    Pneumonia Maternal Grandmother    Cancer Maternal Grandfather    Heart attack Paternal Grandmother    Pneumonia Paternal Grandfather    High Cholesterol Son    Social History: [name] lives in a house built 42 years old, no water damage, carpet floors, gas heating, central AC, dogs and cats indoors, no roaches, no DM protection. 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  No HEPA filter in the home.  [place]  He smokes 1 pack/day since 1998.  He is currently trying to quit.  ROS:  All other systems negative except as noted per HPI.  Objective:  Blood pressure 130/72, pulse 81, temperature 98.1 F (36.7 C), temperature source Temporal, resp. rate 18, height 6' 0.5" (1.842 m), weight 198 lb 12.8 oz (90.2 kg), SpO2 96 %. Body mass index is 26.59 kg/m. Physical Exam:  General Appearance:  Alert, cooperative, no distress, appears stated age  Head:  Normocephalic, without obvious abnormality, atraumatic  Eyes:  Conjunctiva clear, EOM's intact  Nose: Nares normal, hypertrophic turbinates, normal mucosa, and no visible anterior polyps  Throat: Lips, tongue normal; teeth and gums normal, normal posterior oropharynx  Neck: Supple, symmetrical  Lungs:   clear to auscultation bilaterally, Respirations unlabored, no coughing  Heart:  regular rate and rhythm and no murmur, Appears well perfused  Extremities: No edema  Skin: Skin color, texture, turgor normal, no rashes or lesions on visualized portions of skin  Neurologic: No gross deficits   Diagnostics: Spirometry:  Tracings reviewed. His effort: Good reproducible efforts. FVC: 4.03L (pre), 3.59L  (post) FEV1: 2.18L, 49% predicted (pre), 2.35L, 53% predicted (post) FEV1/FVC ratio: 0.54 (pre), 0.65 (post) Interpretation: Spirometry  consistent with mixed obstructive and restrictive disease with partial bronchodilator response in FEV1 only  Skin Testing: Environmental allergy panel and select foods.  Adequate controls. Results discussed with patient/family.  Airborne Adult Perc - 12/05/22 1529     Time Antigen Placed 0200    Allergen Manufacturer Lavella Hammock    Location Back    Number of Test 59    1. Control-Buffer 50% Glycerol Negative    2. Control-Histamine 1 mg/ml 3+    3. Albumin saline Negative    4. Corona Negative    5. [place] Negative    6. Johnson Negative    7. Wade Blue Negative    8. Meadow Fescue Negative    9. Perennial Rye Negative    10. Sweet Vernal Negative    11. Timothy Negative    12. Cocklebur 2+    13. Burweed Marshelder Negative    14. Ragweed, short Negative    15. Ragweed, Giant Negative    16. Plantain,  English Negative  17. Lamb's Quarters Negative    18. Sheep Sorrell Negative    19. Rough Pigweed Negative    20. Marsh Elder, Rough Negative    21. Mugwort, Common Negative    22. Ash mix Negative    23. Birch mix 3+    24. Beech American Negative    25. Box, Elder Negative    26. Cedar, red Negative    27. Cottonwood, [place] Negative    28. Elm mix Negative    29. Hickory Negative    30. Maple mix Negative    31. Oak, [place] mix Negative    32. Pecan Pollen Negative    33. Pine mix Negative    34. Sycamore Eastern Negative    35. [place], Black Pollen Negative    36. Alternaria alternata Negative    37. Cladosporium Herbarum Negative    38. Aspergillus mix Negative    39. Penicillium mix Negative    40. Bipolaris sorokiniana (Helminthosporium) Negative    41. Drechslera spicifera (Curvularia) Negative    42. Mucor plumbeus Negative    43. Fusarium moniliforme Negative    44. Aureobasidium pullulans (pullulara) Negative    45. Rhizopus oryzae Negative    46. Botrytis cinera Negative    47. Epicoccum nigrum Negative    48. Phoma betae Negative    49. Candida  Albicans Negative    50. Trichophyton mentagrophytes Negative    51. Mite, D Farinae  5,000 AU/ml Negative    52. Mite, D Pteronyssinus  5,000 AU/ml 3+    53. Cat Hair 10,000 BAU/ml Negative    54.  Dog Epithelia Negative    55. Mixed Feathers Negative    56. Horse Epithelia Negative    57. Cockroach, German Negative    58. Mouse Negative    59. Tobacco Leaf Negative             Food Adult Perc - 12/05/22 1500     Time Antigen Placed 1528    Allergen Manufacturer Lavella Hammock    Location Back    Number of allergen test 4    30. Barley Negative    32. Rye  Negative    33. Hops Negative    36. Saccharomyces Cerevisiae  Negative             Allergy testing results were read and interpreted by myself, documented by clinical staff.  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  Use for at least 1-2 weeks or until symptoms resolve. - Rinse mouth out after use - Rescue Inhaler: Airsupra. Use 2-4 puffs  as needed for chest tightness, wheezing, or coughing.  Can also use 15 minutes prior to exercise if you have symptoms with activity. Maximum 12 puffs/day - Asthma is not controlled if:  - Symptoms are occurring >2 times a week OR  - >2 times a month nighttime awakenings  - You are requiring systemic steroids (prednisone/steroid injections) more than once per year  - Your require hospitalization for your asthma.  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   -Options include Zyrtec (Cetirizine) '10mg'$ , Claritin (Loratadine) '10mg'$ , Allegra (Fexofenadine) '180mg'$ , or Xyzal (Levocetirinze) '5mg'$  - Can be purchased over-the-counter if not covered by insurance.  Food allergy/intolerance:  - suspect food poisoning to crayfish, reassured that you are eating and tolerating shellfish ruling out shellfish allergy - suspect alcohol intolerance as cause of immediate vomiting from drinking  - given your pancreatic enzyme deficiency, would recommend complete avoidance of all alcohol - allergy skin testing negative to hops, barley, baker's yeast and rye -will check blood work as well today for confirmation -low suspicion for true alcohol allergy, but in case of SKIN  (hives) + ANY additional symptoms (vomiting, diarrhea, nausea, trouble breathing, coughing), OR IF concern for LIFE THREATENING reaction = Epipen Autoinjector EpiPen 0.3 mg. - If using Epinephrine autoinjector, call 911  GERD:  - diet and lifesyle modifications as below  Follow up : 2-3 months, sooner if needed It was a pleasure meeting you in clinic today! Thank you for allowing me to participate in your care.  This note in its entirety was forwarded to the Provider who requested this consultation.  Thank you for your kind referral. I appreciate the opportunity to take part in Lijah's care. Please do not hesitate to contact me with questions.  Sincerely,  Sigurd Sos, MD [facility]

## 2022-12-05 ENCOUNTER — Other Ambulatory Visit: Payer: Self-pay

## 2022-12-05 ENCOUNTER — Encounter: Payer: Self-pay | Admitting: Internal Medicine

## 2022-12-05 ENCOUNTER — Ambulatory Visit: Payer: BC Managed Care – PPO | Admitting: Internal Medicine

## 2022-12-05 VITALS — BP 130/72 | HR 81 | Temp 98.1°F | Resp 18 | Ht 72.5 in | Wt 198.8 lb

## 2022-12-05 DIAGNOSIS — J302 Other seasonal allergic rhinitis: Secondary | ICD-10-CM | POA: Diagnosis not present

## 2022-12-05 DIAGNOSIS — T781XXA Other adverse food reactions, not elsewhere classified, initial encounter: Secondary | ICD-10-CM

## 2022-12-05 DIAGNOSIS — J3089 Other allergic rhinitis: Secondary | ICD-10-CM

## 2022-12-05 DIAGNOSIS — J4530 Mild persistent asthma, uncomplicated: Secondary | ICD-10-CM | POA: Insufficient documentation

## 2022-12-05 DIAGNOSIS — K9049 Malabsorption due to intolerance, not elsewhere classified: Secondary | ICD-10-CM | POA: Insufficient documentation

## 2022-12-05 DIAGNOSIS — K219 Gastro-esophageal reflux disease without esophagitis: Secondary | ICD-10-CM | POA: Diagnosis not present

## 2022-12-05 MED ORDER — EPINEPHRINE 0.3 MG/0.3ML IJ SOAJ: 0.3000 mg | INTRAMUSCULAR | 2 refills | Status: DC | PRN

## 2022-12-05 MED ORDER — AIRSUPRA 90-80 MCG/ACT IN AERO: 2.0000 | INHALATION_SPRAY | RESPIRATORY_TRACT | 3 refills | Status: DC | PRN

## 2022-12-05 MED ORDER — BUDESONIDE-FORMOTEROL FUMARATE 80-4.5 MCG/ACT IN AERO: 2.0000 | INHALATION_SPRAY | Freq: Two times a day (BID) | RESPIRATORY_TRACT | 5 refills | Status: DC

## 2022-12-05 NOTE — Patient Instructions (Addendum)
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  Use for at least 1-2 weeks or until symptoms resolve. - Rinse mouth out after use - Rescue Inhaler:  Airsupra . Use 2-4 puffs  as needed for chest tightness, wheezing, or coughing.  Can also use 15 minutes prior to exercise if you have symptoms with activity. Maximum 12 puffs/day - Asthma is not controlled if:  - Symptoms are occurring >2 times a week OR  - >2 times a month nighttime awakenings  - You are requiring systemic steroids (prednisone/steroid injections) more than once per year  - Your require hospitalization for your asthma.  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   -Options include Zyrtec (Cetirizine) '10mg'$ , Claritin (Loratadine) '10mg'$ , Allegra (Fexofenadine) '180mg'$ , or Xyzal (Levocetirinze) '5mg'$  - Can be purchased over-the-counter if not covered by insurance.  Food allergy/intolerance:  - suspect food poisoning to crayfish, reassured that you are eating and tolerating shellfish ruling out shellfish allergy - suspect  alcohol intolerance as cause of immediate vomiting from drinking  - given your pancreatic enzyme deficiency, would recommend complete avoidance of all alcohol - allergy skin testing negative to hops, barley, baker's yeast and rye -will check blood work as well today for confirmation -low suspicion for true alcohol allergy, but in case of SKIN  (hives) + ANY additional symptoms (vomiting, diarrhea, nausea, trouble breathing, coughing), OR IF concern for LIFE THREATENING reaction = Epipen Autoinjector EpiPen 0.3 mg. - If using Epinephrine autoinjector, call 911  GERD:  - diet and lifesyle modifications as below  Follow up : 2-3 months, sooner if needed It was a pleasure meeting you in clinic today! Thank you for allowing me to participate in your care.  Sigurd Sos, MD [facility]   Gastroesophageal Reflux Induced Respiratory Disease and Laryngopharyngeal Reflux (LPR): Gastroesophageal reflux disease (GERD) is a condition where the contents of the stomach reflux or back up into the esophagus or swallowing tube.  This can result in a variety of clinical symptoms including classic symptoms and atypical symptoms.  Classic symptoms of GERD include: heartburn, chest pain, acid taste in the mouth, and difficulty in swallowing.  Atypical symptoms of GERD include laryngopharyngeal reflux (LPR) and asthma.  LPR occurs when stomach reflux comes all the way up to the throat.  Clinical symptoms include hoarseness, raspy voice, laryngitis, throat clearing, postnasal drip, mucus stuck in the throat, a sensation of a lump in the throat, sore throat, and cough.  Most patients with LPR do not have classic symptoms of GERD.  Asthma can also be triggered by GERD.  The acid stomach fluid can stimulate nerve fibers in  the esophagus which can cause an increase in bronchial muscle tone and narrowing of the airways.  Acid stomach contents may also reflux into the trachea and bronchi of the lungs  where it can trigger an asthma attack.  Many people with GERD triggered asthma do not have classic symptoms of GERD.  Diagnosis of LPR and GERD induced asthma is frequently made from a typical history and response to medications.  It may take several months of medications to see a good response.  Occasionally, a 24-hour esophageal pH probe study must be performed.  Treatment of GERD/LPR includes:   Modification of diet and lifestyle Stop smoking Avoid overeating and lose weight Avoid acidic and fatty foods, chocolate, onions, garlic, peppermint Elevate the head of your bed 6 to 8 inches with blocks or wedge Medications Zantac, Pepcid, Axid, Tagamet Prilosec, Prevacid, Aciphex, Protonix, Nexium Surgery  DUST MITE AVOIDANCE MEASURES:  There are three main measures that need and can be taken to avoid house dust mites:  Reduce accumulation of dust in general -reduce furniture, clothing, carpeting, books, stuffed animals, especially in bedroom  Separate yourself from the dust -use pillow and mattress encasements (can be found at stores such as [employer] or online) -avoid direct exposure to air condition flow -use a HEPA filter device, especially in the bedroom; you can also use a HEPA filter vacuum cleaner -wipe dust with a moist towel instead of a dry towel or broom when cleaning  Decrease mites and/or their secretions -wash clothing and linen and stuffed animals at highest temperature possible, at least every 2 weeks -stuffed animals can also be placed in a bag and put in a freezer overnight  Despite the above measures, it is impossible to eliminate dust mites or their allergen completely from your home.  With the above measures the burden of mites in your home can be diminished, with the goal of minimizing your allergic symptoms.  Success will be reached only when implementing and using all means together. Reducing Pollen Exposure  The American Academy of Allergy, Asthma  and Immunology suggests the following steps to reduce your exposure to pollen during allergy seasons.    Do not hang sheets or clothing out to dry; pollen may collect on these items. Do not mow lawns or spend time around freshly cut grass; mowing stirs up pollen. Keep windows closed at night.  Keep car windows closed while driving. Minimize morning activities outdoors, a time when pollen counts are usually at their highest. Stay indoors as much as possible when pollen counts or humidity is high and on windy days when pollen tends to remain in the air longer. Use air conditioning when possible.  Many air conditioners have filters that trap the pollen spores. Use a HEPA room air filter to remove pollen form the indoor air you breathe.

## 2022-12-12 LAB — ALLERGENS, ZONE 2

## 2022-12-12 LAB — ALLERGEN, BAKERS YEAST, F45: F045-IgE Yeast: <0.1 kU/L

## 2022-12-12 LAB — ALLERGEN, HOPS/FRUIT CONE, IGE: Hops: <0.1 kU/L

## 2022-12-12 LAB — ALLERGEN, MALT,F90: Malt: <0.1 kU/L

## 2022-12-12 LAB — ALLERGEN, RYE, F5: Rye IgE: <0.1 kU/L

## 2022-12-13 NOTE — Progress Notes (Signed)
Please let Randall Shelton know that his allergy tests have returned. His malt, hops, yeast and rye were negative.  I do not suspect allergic reaction to alcohol but rather intolerance. He does not need an epipen, but he should avoid alcohol due to his pancreatic enzyme deficiency. His environmental allergy test was also negative, but he did show positives on skin testing. I would continue environmental avoidance measures and plan as we discussed in clinic.  Let me know if he has any questions or concerns.

## 2022-12-17 ENCOUNTER — Ambulatory Visit: Payer: Self-pay | Admitting: Allergy & Immunology

## 2023-01-28 ENCOUNTER — Ambulatory Visit: Payer: BC Managed Care – PPO | Admitting: Family Medicine

## 2023-01-28 ENCOUNTER — Other Ambulatory Visit: Admission: RE | Admit: 2023-01-28 | Payer: BC Managed Care – PPO | Source: Home / Self Care

## 2023-01-28 ENCOUNTER — Encounter: Payer: Self-pay | Admitting: Family Medicine

## 2023-01-28 VITALS — BP 128/72 | HR 86 | Ht 73.0 in | Wt 201.0 lb

## 2023-01-28 DIAGNOSIS — Z720 Tobacco use: Secondary | ICD-10-CM

## 2023-01-28 DIAGNOSIS — J4530 Mild persistent asthma, uncomplicated: Secondary | ICD-10-CM | POA: Diagnosis not present

## 2023-01-28 DIAGNOSIS — K8681 Exocrine pancreatic insufficiency: Secondary | ICD-10-CM | POA: Diagnosis not present

## 2023-01-28 DIAGNOSIS — Z13228 Encounter for screening for other metabolic disorders: Secondary | ICD-10-CM | POA: Diagnosis not present

## 2023-01-28 MED ORDER — BUDESONIDE-FORMOTEROL FUMARATE 80-4.5 MCG/ACT IN AERO: 2.0000 | INHALATION_SPRAY | Freq: Two times a day (BID) | RESPIRATORY_TRACT | 5 refills | Status: DC

## 2023-01-28 NOTE — Assessment & Plan Note (Signed)
Ongoing, noted skin rash with bupropion, offered alternative, he will consider at a later time.

## 2023-01-28 NOTE — Assessment & Plan Note (Signed)
Brought up by allergist in setting of pancreatic dysfunction, will order testing.

## 2023-01-28 NOTE — Assessment & Plan Note (Signed)
Recently established with allergist, started on Symbicort but patient has not begun usage yet. - Start Symbicort BID scheduled reinforced - DC albuterol - Can use Airsupra instead PRN - Maintain follow-up with allergy group

## 2023-01-28 NOTE — Progress Notes (Signed)
     Primary Care / Sports Medicine Office Visit  Patient Information:  Patient ID: Randall Shelton, male DOB: [birth year] Age: 42 y.o. MRN: [id number]   Randall Shelton is a pleasant 42 y.o. male presenting with the following:  Chief Complaint  Patient presents with   Follow-up    Reflux good Asthma flares up at times     Vitals:   01/28/23 0920  BP: 128/72  Pulse: 86  SpO2: 98%   Vitals:   01/28/23 0920  Weight: 201 lb (91.2 kg)  Height: 6\' 1"  (1.854 m)   Body mass index is 26.52 kg/m.  No results found.   Independent interpretation of notes and tests performed by another provider:   None  Procedures performed:   None  Pertinent History, Exam, Impression, and Recommendations:   Randall Shelton was seen today for follow-up.  Cystic fibrosis screening Assessment & Plan: Brought up by allergist in setting of pancreatic dysfunction, will order testing.  Orders: -     GeneSeq PLUS, CFTR  Exocrine pancreatic insufficiency Assessment & Plan: Has upcoming visit with GI, continues to drink alcohol but has limited it. Plan as follows: - Strongly advised to stop alcohol intake until establishing with GI - Follow peripherally on this issue   Mild persistent asthma without complication Assessment & Plan: Recently established with allergist, started on Symbicort but patient has not begun usage yet. - Start Symbicort BID scheduled reinforced - DC albuterol - Can use Airsupra instead PRN - Maintain follow-up with allergy group  Orders: -     Budesonide-Formoterol Fumarate; Inhale 2 puffs into the lungs in the morning and at bedtime. Start at the onset of upper respiratory illness/asthma flare and use 2 puffs twice daily for 2 weeks or until symptoms resolve.  Dispense: 1 each; Refill: 5  Tobacco abuse Assessment & Plan: Ongoing, noted skin rash with bupropion, offered alternative, he will consider at a later time.      Orders & Medications Meds ordered this encounter   Medications   budesonide-formoterol (SYMBICORT) 80-4.5 MCG/ACT inhaler    Sig: Inhale 2 puffs into the lungs in the morning and at bedtime. Start at the onset of upper respiratory illness/asthma flare and use 2 puffs twice daily for 2 weeks or until symptoms resolve.    Dispense:  1 each    Refill:  5   Orders Placed This Encounter  Procedures   GeneSeq PLUS, CFTR     No follow-ups on file.     Jerrol Banana, MD, [facility]   Primary Care Sports Medicine Primary Care and Sports Medicine at [facility]

## 2023-01-28 NOTE — Assessment & Plan Note (Signed)
Has upcoming visit with GI, continues to drink alcohol but has limited it. Plan as follows: - Strongly advised to stop alcohol intake until establishing with GI - Follow peripherally on this issue

## 2023-02-08 LAB — BEACON CARRIER SCREEN, PARTNER

## 2023-02-20 NOTE — Progress Notes (Signed)
Thank you I am unable to find the results.  Will the PDF be scanned to his chart? I will continue to look for it.

## 2023-02-26 NOTE — Progress Notes (Signed)
Thanks, I see in comments that it says negative. Have a great day.

## 2023-03-05 NOTE — Progress Notes (Deleted)
FOLLOW UP Date of Service/Encounter:  03/05/23   Subjective:  Randall Shelton (DOB: [birth year]) is a 42 y.o. male who [facility] in re-evaluation of the following: *** History obtained from: chart review and {Persons; PED relatives w/patient:19415::"patient"}.  For Review, LV was on 12/05/22  with Dr.Odies Desa seen for intial visit for asthma, allergic rhinitis . See below for summary of history and diagnostics.  Therapeutic plans/changes recommended: We recommended starting symbicort 80 mcg 2 puffs BID, Airsupra PRN, goals to reduce smoking, controlling reflux and drainage. Started on azelastine, start AH. 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 Infrequent use of rescue inhaler.  Triggers: respiratory illness, irritant, change of seasons. Has some throat tightening with strong smells Hospitalized multiple times in childhood due to asthma Getting steroids once yearly during season change from fall to winter due to breathing difficulty smoker 0.5 ppd x 26 years - has been up to 1.5 to 2 ppd.  -  spirometry (12/05/22): ratio 0.54, 49% FEV1 (pre), + 52% FEV1 (post)-mixed obstruction/restriction with partial response in FEV1 - CXR 10/30/22: read as normal Allergic Rhinitis:  Constant sinus drainage. Dry air is a trigger. Reflux previously an issue, but feels now controlled. No prior ENT surgeries.  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 Malt liquor does the same, other liquors he is fine.  He had similar symptoms with champagne once.  Happening with domestic beers. This happens with half a beer.  - SPT select foods (12/05/22): negative to hops, barley, baker's  yeast and rye  Other: pancreatic enzyme deficiency. He was on enzyme replacement but this was unaffordable at this time. He has found ways to work around it with his diet. Planning follow-up with GI soon. Genetic testing for CF negative. --------------------------------------------------- Today presents for follow-up. ***  Allergies as of 03/07/2023   Not on File      Medication List        Accurate as of March 05, 2023  2:25 PM. If you have any questions, ask your nurse or doctor.          Airsupra 90-80 MCG/ACT Aero Generic drug: Albuterol-Budesonide Inhale 2 Inhalations into the lungs as needed. Maximum 12 puffs/day.   budesonide-formoterol 80-4.5 MCG/ACT inhaler Commonly known as: Symbicort Inhale 2 puffs into the lungs in the morning and at bedtime. Start at the onset of upper respiratory illness/asthma flare and use 2 puffs twice daily for 2 weeks or until symptoms resolve.       Past Medical History:  Diagnosis Date   Asthma    Chronic back pain    Irregular heart beat    Pancreatitis    Proctocolitis    Past Surgical History:  Procedure Laterality Date   INCISION AND DRAINAGE ABSCESS N/A 08/18/2020   Procedure: INCISION AND DRAINAGE ABSCESS, perirectal abscess;  Surgeon: Campbell Lerner, MD;  Location: [facility] ORS;  Service: General;  Laterality: N/A;   WISDOM TOOTH EXTRACTION Bilateral 2002   Otherwise, there have been no changes to his past medical history, surgical history, family history, or social history.  ROS: All others negative except as noted per HPI.   Objective:  There were no vitals taken for this visit. There is no height or weight on file to calculate BMI. Physical Exam: General  Appearance:  Alert, cooperative, no distress, appears stated age  Head:  Normocephalic, without obvious abnormality, atraumatic  Eyes:  Conjunctiva clear, EOM's intact  Nose: Nares normal, {Blank multiple:19196:a:"***","hypertrophic turbinates","normal mucosa","no  visible anterior polyps","septum midline"}  Throat: Lips, tongue normal; teeth and gums normal, {Blank multiple:19196:a:"***","normal posterior oropharynx","tonsils 2+","tonsils 3+","no tonsillar exudate","+ cobblestoning","surgically absent tonsils"}  Neck: Supple, symmetrical  Lungs:   {Blank multiple:19196:a:"***","clear to auscultation bilaterally","end-expiratory wheezing","wheezing throughout"}, Respirations unlabored, {Blank multiple:19196:a:"***","no coughing","intermittent dry coughing"}  Heart:  {Blank multiple:19196:a:"***","regular rate and rhythm","no murmur"}, Appears well perfused  Extremities: No edema  Skin: {Blank multiple:19196:a:"***","Skin color, texture, turgor normal","no rashes or lesions on visualized portions of skin"}  Neurologic: No gross deficits   Labs: ***  Spirometry:  Tracings reviewed. His effort: {Blank single:19197::"Good reproducible efforts.","It was hard to get consistent efforts and there is a question as to whether this reflects a maximal maneuver.","Poor effort, data can not be interpreted.","Variable effort-results affected","effort okay for first attempt at spirometry.","Results not reproducible due to ***"} FVC: ***L FEV1: ***L, ***% predicted FEV1/FVC ratio: ***% Interpretation: {Blank single:19197::"Spirometry consistent with mild obstructive disease","Spirometry consistent with moderate obstructive disease","Spirometry consistent with severe obstructive disease","Spirometry consistent with possible restrictive disease","Spirometry consistent with mixed obstructive and restrictive disease","Spirometry uninterpretable due to technique","Spirometry consistent with normal pattern","No overt abnormalities noted given today's efforts","Nonobstructive ratio, low FEV1","Nonobstructive ratio, low FEV1, possible restriction"}.  Please see scanned spirometry results for details.  Skin Testing: {Blank single:19197::"Select foods","Environmental allergy  panel","Environmental allergy panel and select foods","Food allergy panel","None","Deferred due to recent antihistamines use","deferred due to recent reaction","Pediatric Environmental Allergy Panel","Pediatric Food Panel","Select foods and environmental allergies"}. {Blank single:19197::"Adequate positive and negative controls","Inadequate positive control-testing invalid","Adequate positive and negative controls, dermatographism present, testing difficult to interpret"}. Results discussed with patient/family.   {Blank single:19197::"Allergy testing results were read and interpreted by myself, documented by clinical staff.","Allergy testing results were read by ***,FNP, documented by clinical staff"}  Assessment/Plan   ***  Tonny Bollman, MD  [facility]

## 2023-03-07 ENCOUNTER — Ambulatory Visit: Payer: Self-pay | Admitting: Internal Medicine

## 2023-03-07 DIAGNOSIS — J309 Allergic rhinitis, unspecified: Secondary | ICD-10-CM

## 2023-03-12 ENCOUNTER — Ambulatory Visit: Payer: BC Managed Care – PPO | Admitting: Internal Medicine

## 2023-03-18 ENCOUNTER — Ambulatory Visit: Payer: BC Managed Care – PPO | Admitting: Internal Medicine

## 2023-03-18 DIAGNOSIS — J302 Other seasonal allergic rhinitis: Secondary | ICD-10-CM

## 2023-03-18 DIAGNOSIS — J4489 Other specified chronic obstructive pulmonary disease: Secondary | ICD-10-CM | POA: Diagnosis not present

## 2023-03-18 DIAGNOSIS — J3089 Other allergic rhinitis: Secondary | ICD-10-CM | POA: Diagnosis not present

## 2023-03-18 DIAGNOSIS — K9049 Malabsorption due to intolerance, not elsewhere classified: Secondary | ICD-10-CM | POA: Diagnosis not present

## 2023-03-18 DIAGNOSIS — K219 Gastro-esophageal reflux disease without esophagitis: Secondary | ICD-10-CM

## 2023-03-18 MED ORDER — BUDESONIDE-FORMOTEROL FUMARATE 80-4.5 MCG/ACT IN AERO: 2.0000 | INHALATION_SPRAY | Freq: Two times a day (BID) | RESPIRATORY_TRACT | 5 refills | Status: DC

## 2023-03-18 MED ORDER — METHYLPREDNISOLONE ACETATE 40 MG/ML IJ SUSP
40.0000 mg | Freq: Once | INTRAMUSCULAR | Status: AC
  Administered 2023-03-18: 40 mg via INTRAMUSCULAR

## 2023-03-18 MED ORDER — AIRSUPRA 90-80 MCG/ACT IN AERO: 2.0000 | INHALATION_SPRAY | RESPIRATORY_TRACT | 3 refills | Status: AC | PRN

## 2023-03-18 MED ORDER — PANTOPRAZOLE SODIUM 20 MG PO TBEC: 20.0000 mg | DELAYED_RELEASE_TABLET | Freq: Every day | ORAL | 3 refills | Status: DC

## 2023-03-18 NOTE — Progress Notes (Unsigned)
Follow Up Note  RE: [name] MRN: [id number] DOB: 1981/09/06 Date of Office Visit: 03/18/2023  Referring provider: Jerrol Banana, MD Primary care provider: Jerrol Banana, MD  Chief Complaint: No chief complaint on file.  History of Present Illness: I had the pleasure of seeing Randall Shelton for a follow up visit at the [facility] on 03/19/2023. He is a 42 y.o. male, who is being followed for persistent asthma, allergic rhinitis. His previous allergy office visit was on 12/05/22 with Dr. Maurine Minister. Today is a regular follow up visit.  History obtained from patient, chart review.  Today reports noncompliance with Symbicort.  He just forgets to use it.  He has maybe taken it once or twice since prescribed.  He has been using air supra multiple times a day.  Continues to have cough, chest tightness.  Continues to avoid alcohol due to pancreatic insufficiency.  Does report needing Tums multiple times a week for GERD.  He is not on any medical therapy for GERD.  Rhinitis symptoms are still persistent.  He has not started his Astelin or daily antihistamine.  He has used his saline nasal spray daily.  Denies any adverse effects of medications he just forgets to take them.  Assessment and Plan: Kevante is a 42 y.o. male with: Asthma-COPD overlap syndrome - Plan: Spirometry with Graph, budesonide-formoterol (SYMBICORT) 80-4.5 MCG/ACT inhaler  Seasonal and perennial allergic rhinitis  Alcohol intolerance  Gastroesophageal reflux disease, unspecified whether esophagitis present   Plan: Patient Instructions  Mild Persistent Asthma with possible COPD overlap, + VCD overlap: - your lung testing today showed severe obstruction and restriction, improved but not at goal  - Depo 40mg  IM given today  - Focus on taking symbicort daily   - Controller Inhaler: Start Symbicort 80 mcg 2 puffs twice a day;  Use daily  - Rinse mouth out after use - Rescue Inhaler:  Airsupra  . Use 2-4 puffs  as needed for chest tightness, wheezing, or coughing.  Can also use 15 minutes prior to exercise if you have symptoms with activity. Maximum 12 puffs/day - Asthma is not controlled if:  - Symptoms are occurring >2 times a week OR  - >2 times a month nighttime awakenings  - You are requiring systemic steroids (prednisone/steroid injections) more than once per year  - Your require hospitalization for your asthma.  - Please call the clinic to schedule a follow up if these symptoms arise - continue working towards quitting smoking   Chronic Rhinitis Seasonal and Perennial Allergic: not well controlled   - Prevention:  - allergen avoidance when possible - consider allergy shots as long term control of your symptoms by teaching your immune system to be more tolerant of your allergy triggers  - Symptom control: - Start Astelin (azelastine) 1-2 sprays in each nostril twice a day as needed for nasal congestion/itchy nose - Start Antihistamine: daily or daily as needed.   -Options include Zyrtec (Cetirizine) 10mg , Claritin (Loratadine) 10mg , Allegra (Fexofenadine) 180mg , or Xyzal (Levocetirinze) 5mg  - Can be purchased over-the-counter if not covered by insurance. - continue daily saline nasal rinse   Food allergy/intolerance:  - continue avoidance of alcohol  In case of SKIN  (hives) + ANY additional symptoms (vomiting, diarrhea, nausea, trouble breathing, coughing), OR IF concern for LIFE THREATENING reaction = Epipen Autoinjector EpiPen 0.3 mg. - If using Epinephrine autoinjector, call 911  GERD:  - diet and lifesyle modifications as below - Start pantoprazole  20mg  daily   Follow up: 3-6 months   Thank you so much for letting me partake in your care today.  Don't hesitate to reach out if you have any additional concerns!  Ferol Luz, MD  [facility] [place], [place]        Meds ordered this encounter  Medications   budesonide-formoterol  (SYMBICORT) 80-4.5 MCG/ACT inhaler    Sig: Inhale 2 puffs into the lungs in the morning and at bedtime. Start at the onset of upper respiratory illness/asthma flare and use 2 puffs twice daily for 2 weeks or until symptoms resolve.    Dispense:  1 each    Refill:  5   Albuterol-Budesonide (AIRSUPRA) 90-80 MCG/ACT AERO    Sig: Inhale 2 Inhalations into the lungs as needed. Maximum 12 puffs/day.    Dispense:  10.7 g    Refill:  3   methylPREDNISolone acetate (DEPO-MEDROL) injection 40 mg   pantoprazole (PROTONIX) 20 MG tablet    Sig: Take 1 tablet (20 mg total) by mouth daily.    Dispense:  90 tablet    Refill:  3    Lab Orders  No laboratory test(s) ordered today   Diagnostics: Spirometry:  Tracings reviewed. His effort: Good reproducible efforts. FVC: 3.82 L FEV1: 2.70 L, 63% predicted FEV1/FVC ratio: 73% Interpretation: Spirometry consistent with mixed obstructive and restrictive disease.  After 4 puffs of albuterol FEV1 increased by 1% and 30 cc, FVC decreased by 6% and 240 cc.  Is not significant postbronchodilator response Please see scanned spirometry results for details.    Medication List:  Current Outpatient Medications  Medication Sig Dispense Refill   pantoprazole (PROTONIX) 20 MG tablet Take 1 tablet (20 mg total) by mouth daily. 90 tablet 3   Albuterol-Budesonide (AIRSUPRA) 90-80 MCG/ACT AERO Inhale 2 Inhalations into the lungs as needed. Maximum 12 puffs/day. 10.7 g 3   budesonide-formoterol (SYMBICORT) 80-4.5 MCG/ACT inhaler Inhale 2 puffs into the lungs in the morning and at bedtime. Start at the onset of upper respiratory illness/asthma flare and use 2 puffs twice daily for 2 weeks or until symptoms resolve. 1 each 5   No current facility-administered medications for this visit.   Allergies: Not on File I reviewed his past medical history, social history, family history, and environmental history and no significant changes have been reported from his previous  visit.  ROS: All others negative except as noted per HPI.   Objective: There were no vitals taken for this visit. There is no height or weight on file to calculate BMI. General Appearance:  Alert, cooperative, no distress, appears stated age  Head:  Normocephalic, without obvious abnormality, atraumatic  Eyes:  Conjunctiva clear, EOM's intact  Nose: Nares normal,  erythematous nasal mucosa, no visible anterior polyps, and septum midline  Throat: Lips, tongue normal; teeth and gums normal, + cobblestoning  Neck: Supple, symmetrical  Lungs:   clear to auscultation bilaterally, Respirations unlabored, no coughing  Heart:  regular rate and rhythm and no murmur, Appears well perfused  Extremities: No edema  Skin: Skin color, texture, turgor normal, no rashes or lesions on visualized portions of skin  Neurologic: No gross deficits   Previous notes and tests were reviewed. The plan was reviewed with the patient/family, and all questions/concerned were addressed.  It was my pleasure to see Randall Shelton today and participate in his care. Please feel free to contact me with any questions or concerns.  Sincerely,  Ferol Luz, MD  Allergy & Immunology  [facility] Office: [phone]

## 2023-03-18 NOTE — Patient Instructions (Addendum)
Mild Persistent Asthma with possible COPD overlap, + VCD overlap: - your lung testing today showed severe obstruction and restriction, improved but not at goal  - Depo 40mg  IM given today  - Focus on taking symbicort daily   - Controller Inhaler: Start Symbicort 80 mcg 2 puffs twice a day;  Use daily  - Rinse mouth out after use - Rescue Inhaler:  Airsupra . Use 2-4 puffs  as needed for chest tightness, wheezing, or coughing.  Can also use 15 minutes prior to exercise if you have symptoms with activity. Maximum 12 puffs/day - Asthma is not controlled if:  - Symptoms are occurring >2 times a week OR  - >2 times a month nighttime awakenings  - You are requiring systemic steroids (prednisone/steroid injections) more than once per year  - Your require hospitalization for your asthma.  - Please call the clinic to schedule a follow up if these symptoms arise - continue working towards quitting smoking   Chronic Rhinitis Seasonal and Perennial Allergic: not well controlled   - Prevention:  - allergen avoidance when possible - consider allergy shots as long term control of your symptoms by teaching your immune system to be more tolerant of your allergy triggers  - Symptom control: - Start Astelin (azelastine) 1-2 sprays in each nostril twice a day as needed for nasal congestion/itchy nose - Start Antihistamine: daily or daily as needed.   -Options include Zyrtec (Cetirizine) 10mg , Claritin (Loratadine) 10mg , Allegra (Fexofenadine) 180mg , or Xyzal (Levocetirinze) 5mg  - Can be purchased over-the-counter if not covered by insurance. - continue daily saline nasal rinse   Food allergy/intolerance:  - continue avoidance of alcohol  In case of SKIN  (hives) + ANY additional symptoms (vomiting, diarrhea, nausea, trouble breathing, coughing), OR IF concern for LIFE THREATENING reaction = Epipen Autoinjector EpiPen 0.3 mg. - If using Epinephrine autoinjector, call 911  GERD:  - diet and lifesyle  modifications as below - Start pantoprazole 20mg  daily   Follow up: 3-6 months   Thank you so much for letting me partake in your care today.  Don't hesitate to reach out if you have any additional concerns!  Ferol Luz, MD  [facility] [place], [place]

## 2023-04-10 IMAGING — CR DG CHEST 2V
3 series · 3 of 3 positions shown · non-contrast
Comparison: 12/02/2018

CLINICAL DATA: Right lower field coarseness.  Shortness of breath.

EXAM:
CHEST - 2 VIEW

[chest pa (1 of 2)]
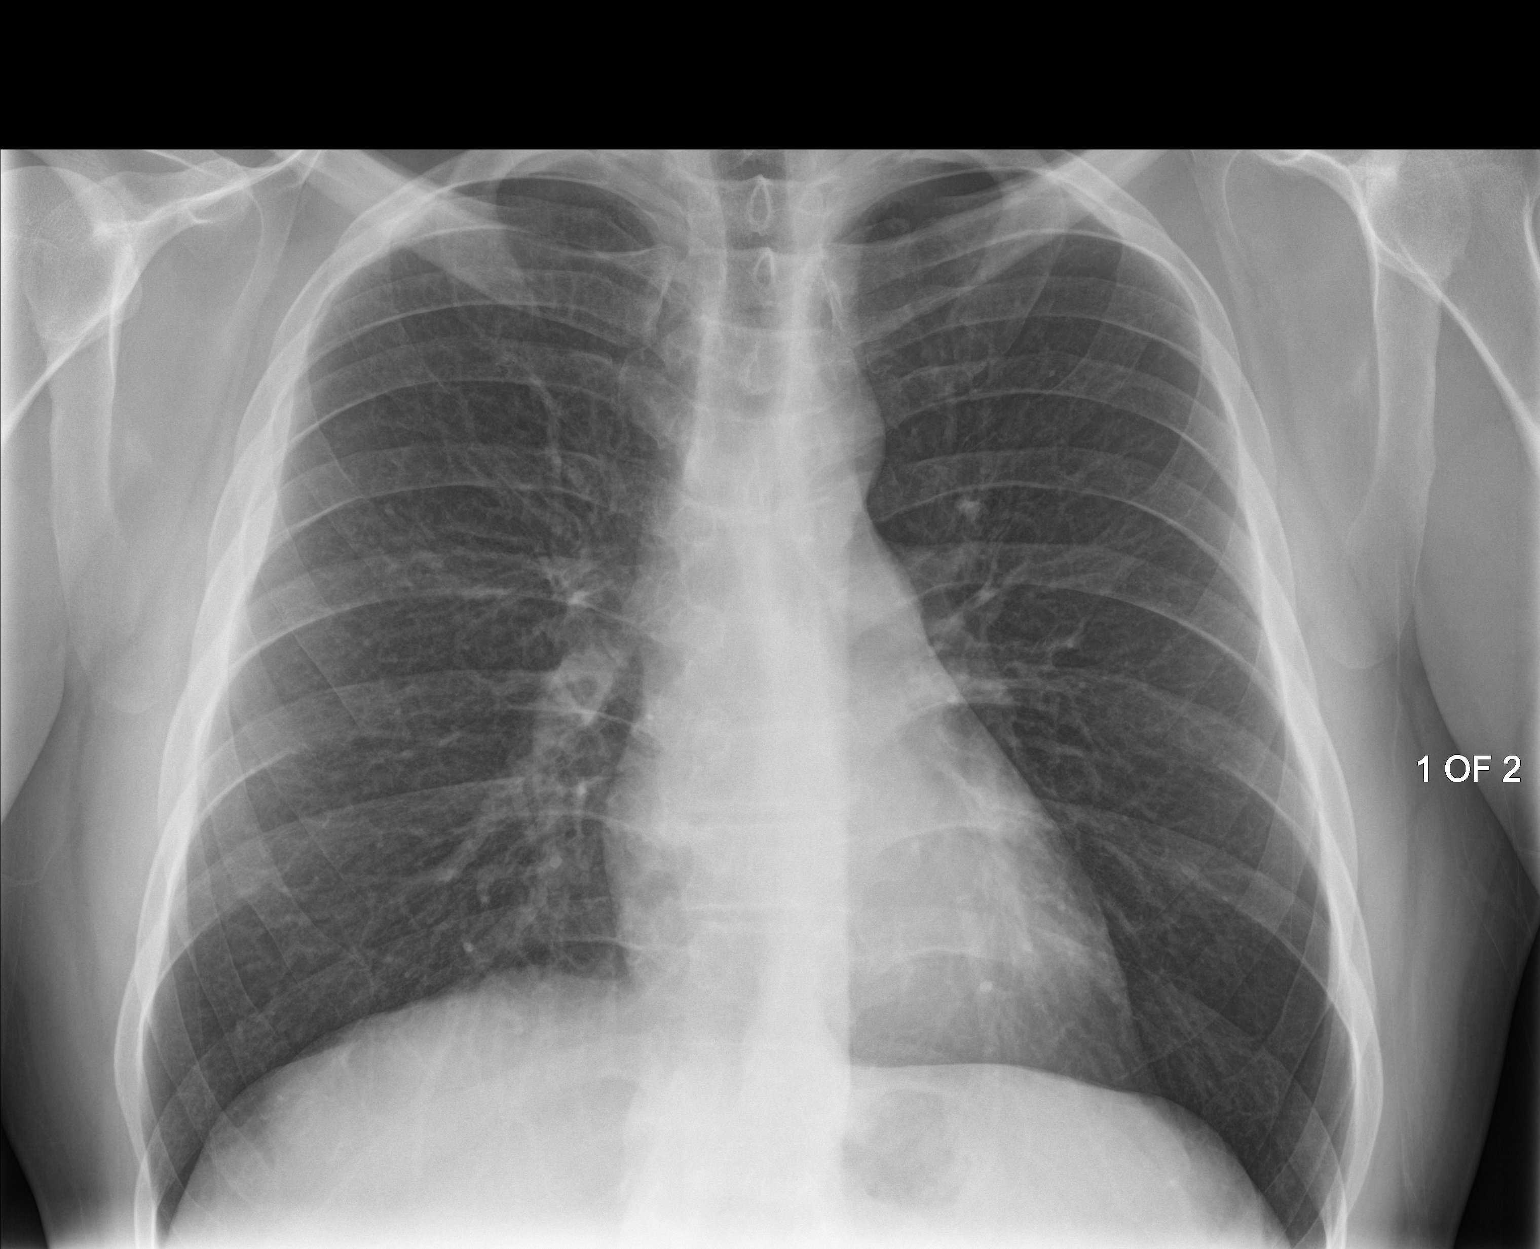

[chest lat]
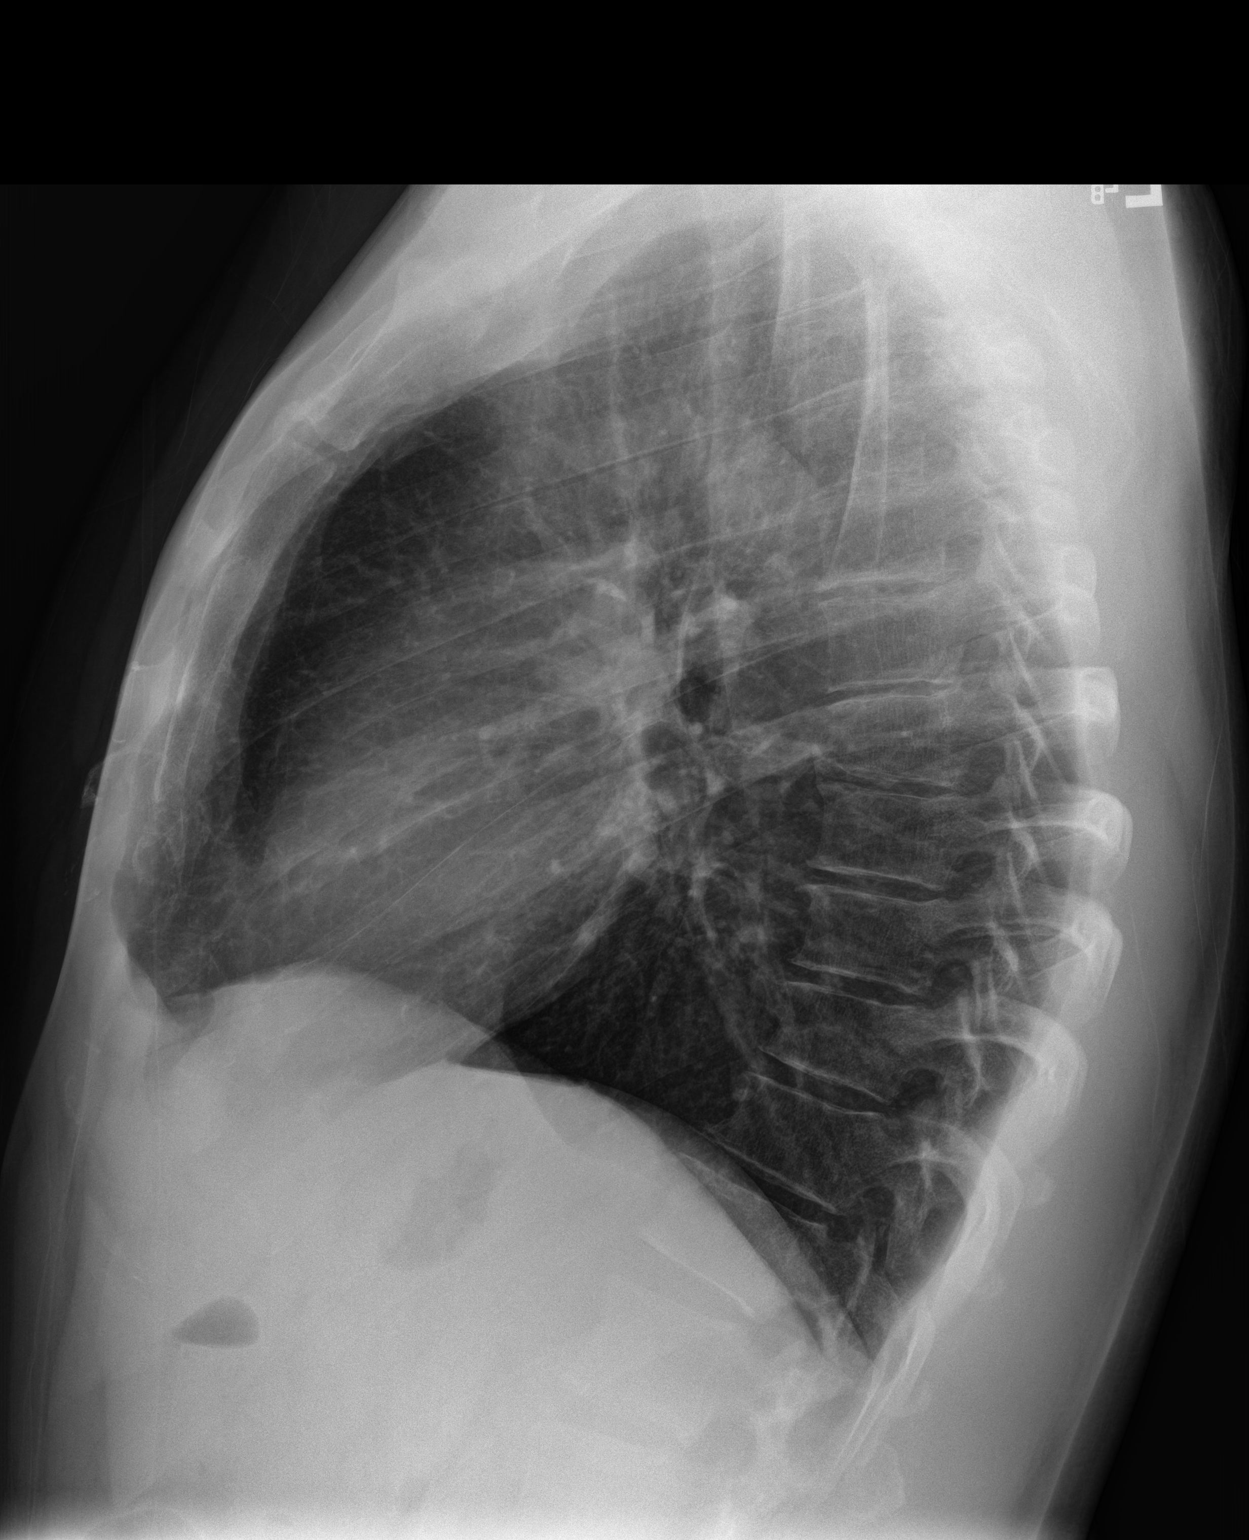

[chest pa (2 of 2)]
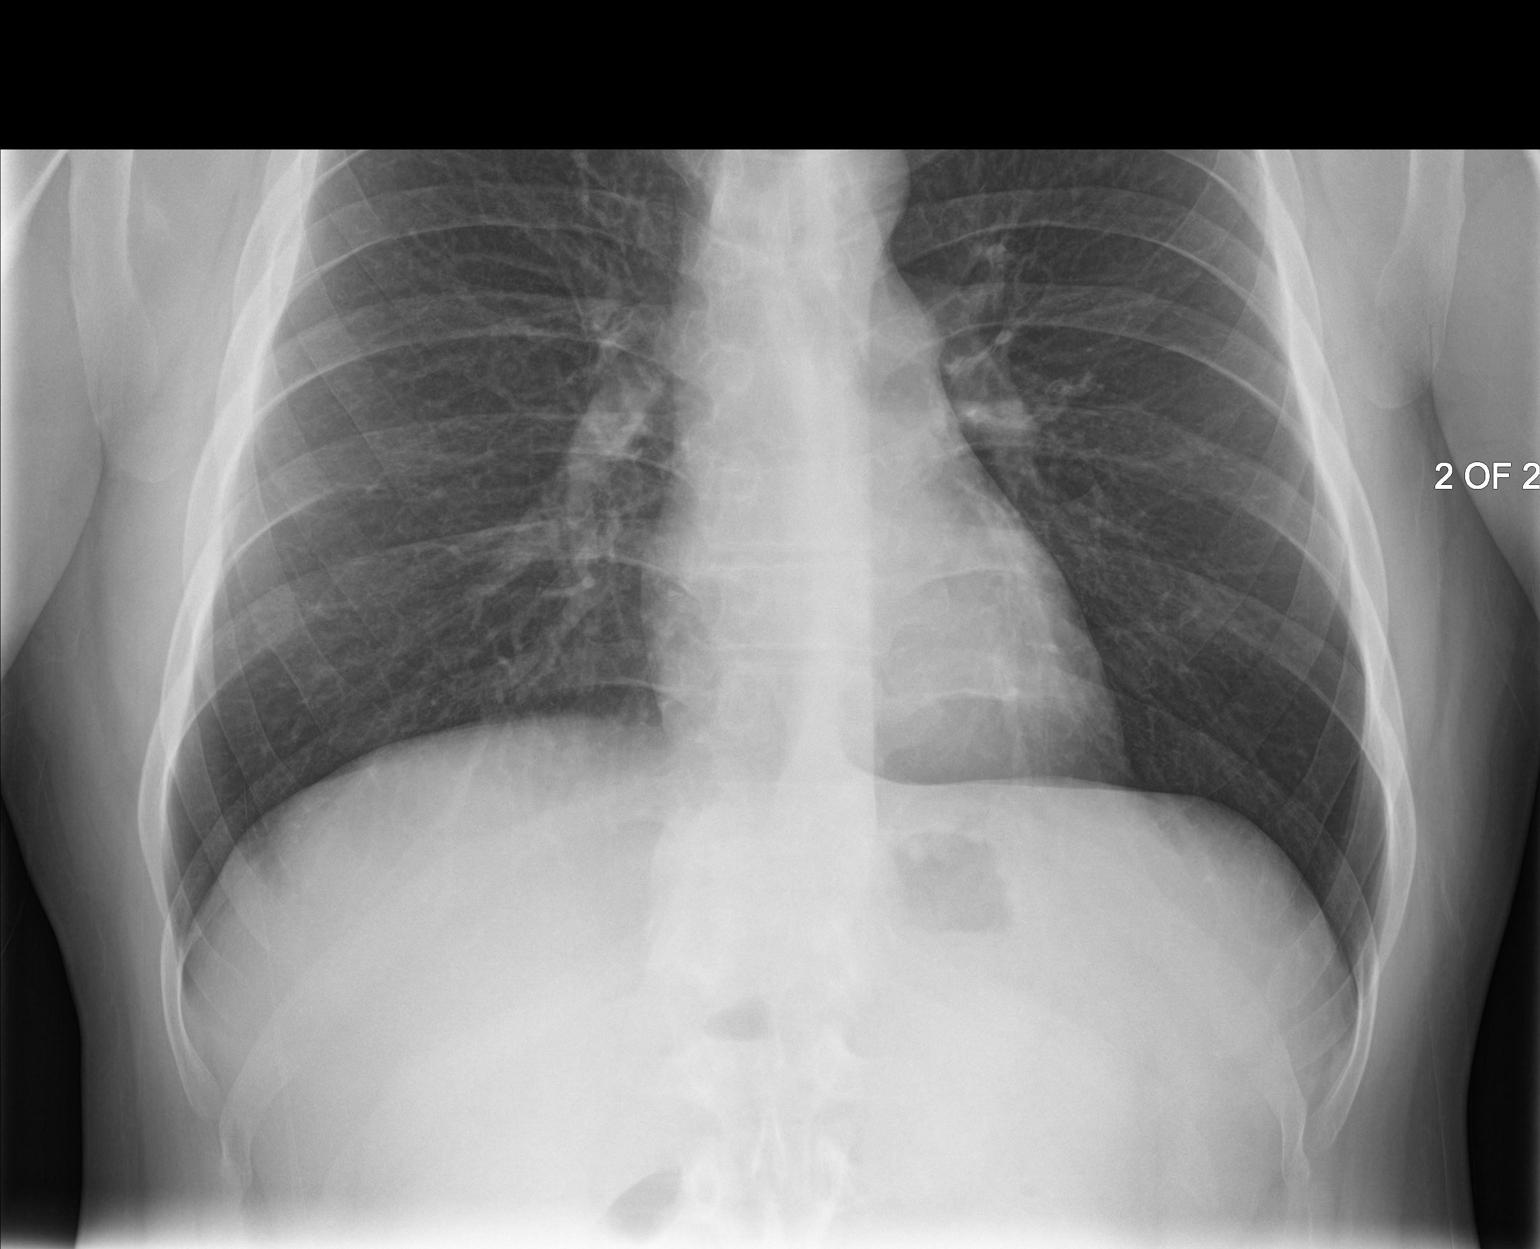

[3 of 3 positions shown; findings below may reference images not displayed]

FINDINGS: The heart size and mediastinal contours are within normal limits.
Both lungs are clear. The visualized skeletal structures are
unremarkable.
IMPRESSION: No active cardiopulmonary disease.

## 2023-05-12 ENCOUNTER — Ambulatory Visit: Payer: BC Managed Care – PPO | Admitting: Gastroenterology

## 2023-05-12 ENCOUNTER — Encounter: Payer: Self-pay | Admitting: Gastroenterology

## 2023-05-12 VITALS — BP 111/74 | HR 73 | Temp 98.7°F | Ht 72.0 in | Wt 203.0 lb

## 2023-05-12 DIAGNOSIS — Z83719 Family history of colon polyps, unspecified: Secondary | ICD-10-CM | POA: Diagnosis not present

## 2023-05-12 DIAGNOSIS — R194 Change in bowel habit: Secondary | ICD-10-CM

## 2023-05-12 MED ORDER — NA SULFATE-K SULFATE-MG SULF 17.5-3.13-1.6 GM/177ML PO SOLN: 1.0000 | Freq: Once | ORAL | 0 refills | Status: AC

## 2023-05-12 NOTE — Progress Notes (Signed)
Gastroenterology Consultation  Referring Provider:     Jerrol Banana, MD Primary Care Physician:  Jerrol Banana, MD Primary Gastroenterologist:  Dr. Servando Snare     Reason for Consultation:     Exocrine pancreatic insufficiency        HPI:   Randall Shelton is a 42 y.o. 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 Ashley Royalty, Ocie Bob, MD. This patient comes in today after being seen by his primary care provider with a question about this patient having exocrine pancreatic insufficiency.  At the last visit the patient was reported to have alcohol use and was told to stop the alcohol use until being seen by me.  The patient also has a history of having a cystic fibrosis test that was reported to be negative.  I do not see any labs for stool elastase. He says he does not drink more then just socially. He says that he went through allergy testing and that was normal. He is not on enzymes now but says he felt better when he was taking the pancreatic enzymes.  He has had recurrent peri anal abscess drainage with still having problems once in a while. The patient reports that he had seen me back in 2014 and at that time had 3 polyps removed that were hyperplastic and random colon biopsies that did not show any abnormalities.  The patient also had an upper endoscopy at that time.  He states that he eats much more now than he has in the past to maintain his weight and has even gone up in weight.  He does report his stools to be foul-smelling and float in the water.  Past Medical History:  Diagnosis Date   Asthma    Chronic back pain    Irregular heart beat    Pancreatitis    Proctocolitis     Past Surgical History:  Procedure Laterality Date   INCISION AND DRAINAGE ABSCESS N/A 08/18/2020   Procedure: INCISION AND DRAINAGE ABSCESS, perirectal abscess;  Surgeon: Campbell Lerner, MD;  Location: [facility] ORS;  Service: General;  Laterality: N/A;   WISDOM  TOOTH EXTRACTION Bilateral 2002    Prior to Admission medications   Medication Sig Start Date End Date Taking? Authorizing Provider  Albuterol-Budesonide (AIRSUPRA) 90-80 MCG/ACT AERO Inhale 2 Inhalations into the lungs as needed. Maximum 12 puffs/day. 03/18/23   Ferol Luz, MD  budesonide-formoterol (SYMBICORT) 80-4.5 MCG/ACT inhaler Inhale 2 puffs into the lungs in the morning and at bedtime. Start at the onset of upper respiratory illness/asthma flare and use 2 puffs twice daily for 2 weeks or until symptoms resolve. 03/18/23   Ferol Luz, MD  pantoprazole (PROTONIX) 20 MG tablet Take 1 tablet (20 mg total) by mouth daily. 03/18/23   Ferol Luz, MD    Family History  Problem Relation Age of Onset   COPD Mother        Smoker   Hiatal hernia Mother    Heart failure Father    Heart disease Father    Hypertension Father    Diverticulitis Father    Bone cancer Father    Seizures Sister    Heart disease Sister    Diabetes Mellitus II Maternal Grandmother    Pneumonia Maternal Grandmother    Cancer Maternal Grandfather    Heart attack Paternal Grandmother    Pneumonia Paternal Grandfather    High Cholesterol Son      Social History   Tobacco Use  Smoking status: Every Day    Current packs/day: 1.00    Average packs/day: 1 pack/day for 25.0 years (25.0 ttl pk-yrs)    Types: Cigarettes   Smokeless tobacco: Never  Vaping Use   Vaping status: Former  Substance Use Topics   Alcohol use: Yes   Drug use: Yes    Types: Marijuana    Allergies as of 05/12/2023   (Not on File)    Review of Systems:    All systems reviewed and negative except where noted in HPI.   Physical Exam:  There were no vitals taken for this visit. No LMP for male patient. General:   Alert,  Well-developed, well-nourished, pleasant and cooperative in NAD Head:  Normocephalic and atraumatic. Eyes:  Sclera clear, no icterus.   Conjunctiva pink. Ears:  Normal auditory acuity. Neck:   Supple; no masses or thyromegaly. Lungs:  Respirations even and unlabored.  Clear throughout to auscultation.   No wheezes, crackles, or rhonchi. No acute distress. Heart:  Regular rate and rhythm; no murmurs, clicks, rubs, or gallops. Abdomen:  Normal bowel sounds.  No bruits.  Soft, non-tender and non-distended without masses, hepatosplenomegaly or hernias noted.  No guarding or rebound tenderness.  Negative Carnett sign.   Rectal:  Deferred.  Pulses:  Normal pulses noted. Extremities:  No clubbing or edema.  No cyanosis. Neurologic:  Alert and oriented x3;  grossly normal neurologically. Skin:  Intact without significant lesions or rashes.  No jaundice. Lymph Nodes:  No significant cervical adenopathy. Psych:  Alert and cooperative. Normal mood and affect.  Imaging Studies: No results found.  Assessment and Plan:   Randall Shelton is a 42 y.o. y/o male who comes in today with a history of pancreatic insufficiency.  The patient was treated in the past by me for this and states that he did feel better on replacement therapy.  Due to the patient's family history with his father having polyps and the change in bowel habits with the patient's report of some rectal bleeding intermittently he will be set up for a colonoscopy.  The patient will also have his stool sent off for fecal elastase and will be treated with pancreatic enzymes if his fecal elastase shows signs of exocrine pancreatic insufficiency.  The patient has been explained the plan and agrees with it.    Midge Minium, MD. Clementeen Graham    Note: This dictation was prepared with Dragon dictation along with smaller phrase technology. Any transcriptional errors that result from this process are unintentional.

## 2023-05-13 DIAGNOSIS — R194 Change in bowel habit: Secondary | ICD-10-CM | POA: Diagnosis not present

## 2023-05-20 ENCOUNTER — Ambulatory Visit: Payer: BC Managed Care – PPO | Admitting: Anesthesiology

## 2023-05-20 ENCOUNTER — Encounter: Payer: Self-pay | Admitting: Gastroenterology

## 2023-05-20 ENCOUNTER — Ambulatory Visit
Admission: RE | Admit: 2023-05-20 | Discharge: 2023-05-20 | Disposition: A | Discharge status: Home / Self Care | Payer: BC Managed Care – PPO | Attending: Gastroenterology | Admitting: Gastroenterology

## 2023-05-20 ENCOUNTER — Encounter
Admission: RE | Disposition: A | Discharge status: Home / Self Care | Payer: Self-pay | Source: Home / Self Care | Attending: Gastroenterology

## 2023-05-20 DIAGNOSIS — K635 Polyp of colon: Secondary | ICD-10-CM | POA: Insufficient documentation

## 2023-05-20 DIAGNOSIS — K621 Rectal polyp: Secondary | ICD-10-CM | POA: Insufficient documentation

## 2023-05-20 DIAGNOSIS — K529 Noninfective gastroenteritis and colitis, unspecified: Secondary | ICD-10-CM | POA: Insufficient documentation

## 2023-05-20 DIAGNOSIS — K648 Other hemorrhoids: Secondary | ICD-10-CM | POA: Diagnosis not present

## 2023-05-20 DIAGNOSIS — K219 Gastro-esophageal reflux disease without esophagitis: Secondary | ICD-10-CM | POA: Insufficient documentation

## 2023-05-20 DIAGNOSIS — F17210 Nicotine dependence, cigarettes, uncomplicated: Secondary | ICD-10-CM | POA: Insufficient documentation

## 2023-05-20 DIAGNOSIS — R194 Change in bowel habit: Secondary | ICD-10-CM | POA: Diagnosis not present

## 2023-05-20 DIAGNOSIS — D125 Benign neoplasm of sigmoid colon: Secondary | ICD-10-CM | POA: Insufficient documentation

## 2023-05-20 DIAGNOSIS — K640 First degree hemorrhoids: Secondary | ICD-10-CM | POA: Insufficient documentation

## 2023-05-20 HISTORY — PX: POLYPECTOMY: SHX5525

## 2023-05-20 HISTORY — PX: BIOPSY: SHX5522

## 2023-05-20 HISTORY — PX: COLONOSCOPY WITH PROPOFOL: SHX5780

## 2023-05-20 SURGERY — COLONOSCOPY WITH PROPOFOL
Anesthesia: General

## 2023-05-20 MED ORDER — MIDAZOLAM HCL 2 MG/2ML IJ SOLN
INTRAMUSCULAR | Status: AC
  Filled 2023-05-20: qty 2

## 2023-05-20 MED ORDER — MIDAZOLAM HCL 2 MG/2ML IJ SOLN
INTRAMUSCULAR | Status: DC | PRN
  Administered 2023-05-20: 2 mg via INTRAVENOUS

## 2023-05-20 MED ORDER — LIDOCAINE HCL (CARDIAC) PF 100 MG/5ML IV SOSY
PREFILLED_SYRINGE | INTRAVENOUS | Status: DC | PRN
  Administered 2023-05-20: 100 mg via INTRAVENOUS

## 2023-05-20 MED ORDER — PROPOFOL 500 MG/50ML IV EMUL
INTRAVENOUS | Status: DC | PRN
  Administered 2023-05-20: 165 ug/kg/min via INTRAVENOUS

## 2023-05-20 MED ORDER — SODIUM CHLORIDE 0.9 % IV SOLN: INTRAVENOUS | Status: DC

## 2023-05-20 MED ORDER — PROPOFOL 10 MG/ML IV BOLUS
INTRAVENOUS | Status: DC | PRN
  Administered 2023-05-20: 30 mg via INTRAVENOUS
  Administered 2023-05-20: 70 mg via INTRAVENOUS

## 2023-05-20 NOTE — Transfer of Care (Signed)
Immediate Anesthesia Transfer of Care Note  Patient: Randall Shelton  Procedure(s) Performed: COLONOSCOPY WITH PROPOFOL POLYPECTOMY BIOPSY  Patient Location: Endoscopy Unit  Anesthesia Type:General  Level of Consciousness: drowsy and patient cooperative  Airway & Oxygen Therapy: Patient Spontanous Breathing and Patient connected to face mask oxygen  Post-op Assessment: Report given to RN and Post -op Vital signs reviewed and stable  Post vital signs: Reviewed and stable  Last Vitals:  Vitals Value Taken Time  BP 98/62 05/20/23 1011  Temp    Pulse 73 05/20/23 1012  Resp 22 05/20/23 1012  SpO2 98 % 05/20/23 1012  Vitals shown include unfiled device data.  Last Pain:  Vitals:   05/20/23 1011  TempSrc:   PainSc: 0-No pain         Complications: No notable events documented.

## 2023-05-20 NOTE — Anesthesia Postprocedure Evaluation (Signed)
Anesthesia Post Note  Patient: Randall Shelton  Procedure(s) Performed: COLONOSCOPY WITH PROPOFOL POLYPECTOMY BIOPSY  Patient location during evaluation: Endoscopy Anesthesia Type: General Level of consciousness: awake and alert Pain management: pain level controlled Vital Signs Assessment: post-procedure vital signs reviewed and stable Respiratory status: spontaneous breathing, nonlabored ventilation, respiratory function stable and patient connected to nasal cannula oxygen Cardiovascular status: blood pressure returned to baseline and stable Postop Assessment: no apparent nausea or vomiting Anesthetic complications: no   No notable events documented.   Last Vitals:  Vitals:   05/20/23 1031 05/20/23 1041  BP: 126/73 121/84  Pulse: 68 (!) 50  Resp: 18 (!) 7  Temp:    SpO2: 100% 99%    Last Pain:  Vitals:   05/20/23 1041  TempSrc:   PainSc: 0-No pain                 Cleda Mccreedy Treyvon Blahut

## 2023-05-20 NOTE — Op Note (Signed)
[facility] Patient Name: Randall Shelton Procedure Date: 05/20/2023 9:39 AM MRN: [id number] Account #: [id number] Date of Birth: 08/16/1981 Admit Type: Outpatient Age: 42 Room: [facility] ENDO ROOM 4 Gender: Male Note Status: Finalized Instrument Name: Prentice Docker [id number] Procedure:             Colonoscopy Indications:           Chronic diarrhea Providers:             Midge Minium MD, MD Referring MD:          No Local Md, MD (Referring MD) Medicines:             Propofol per Anesthesia Complications:         No immediate complications. Procedure:             Pre-Anesthesia Assessment:                        - Prior to the procedure, a History and Physical was                         performed, and patient medications and allergies were                         reviewed. The patient's tolerance of previous                         anesthesia was also reviewed. The risks and benefits                         of the procedure and the sedation options and risks                         were discussed with the patient. All questions were                         answered, and informed consent was obtained. Prior                         Anticoagulants: The patient has taken no anticoagulant                         or antiplatelet agents. ASA Grade Assessment: II - A                         patient with mild systemic disease. After reviewing                         the risks and benefits, the patient was deemed in                         satisfactory condition to undergo the procedure.                        After obtaining informed consent, the colonoscope was                         passed under direct vision. Throughout the procedure,  the patient's blood pressure, pulse, and oxygen                         saturations were monitored continuously. The                         Colonoscope was introduced through the anus and                          advanced to the the cecum, identified by appendiceal                         orifice and ileocecal valve. The colonoscopy was                         performed without difficulty. The patient tolerated                         the procedure well. The quality of the bowel                         preparation was excellent. Findings:      The perianal and digital rectal examinations were normal.      A 4 mm polyp was found in the rectum. The polyp was sessile. The polyp       was removed with a cold biopsy forceps. Resection and retrieval were       complete.      Two sessile polyps were found in the sigmoid colon. The polyps were 2 to       3 mm in size. These polyps were removed with a cold biopsy forceps.       Resection and retrieval were complete.      Non-bleeding internal hemorrhoids were found during retroflexion. The       hemorrhoids were Grade I (internal hemorrhoids that do not prolapse). Impression:            - One 4 mm polyp in the rectum, removed with a cold                         biopsy forceps. Resected and retrieved.                        - Two 2 to 3 mm polyps in the sigmoid colon, removed                         with a cold biopsy forceps. Resected and retrieved.                        - Non-bleeding internal hemorrhoids. Recommendation:        - Discharge patient to home.                        - Resume previous diet.                        - Continue present medications.                        - Await pathology results. Procedure Code(s):     ---  Professional ---                        [phone], Colonoscopy, flexible; with biopsy, single or                         multiple Diagnosis Code(s):     --- Professional ---                        K52.9, Noninfective gastroenteritis and colitis,                         unspecified                        D12.5, Benign neoplasm of sigmoid colon CPT copyright 2022 American Medical Association. All rights reserved. The codes  documented in this report are preliminary and upon coder review may  be revised to meet current compliance requirements. Midge Minium MD, MD 05/20/2023 10:08:21 AM This report has been signed electronically. Number of Addenda: 0 Note Initiated On: 05/20/2023 9:39 AM Scope Withdrawal Time: 0 hours 11 minutes 4 seconds  Total Procedure Duration: 0 hours 12 minutes 34 seconds  Estimated Blood Loss:  Estimated blood loss: none.      [facility]

## 2023-05-20 NOTE — Anesthesia Preprocedure Evaluation (Signed)
Anesthesia Evaluation  Patient identified by MRN, date of birth, ID band Patient awake    Reviewed: Allergy & Precautions, NPO status , Patient's Chart, lab work & pertinent test results  History of Anesthesia Complications Negative for: history of anesthetic complications  Airway Mallampati: III  TM Distance: <3 FB Neck ROM: full    Dental  (+) Chipped   Pulmonary neg shortness of breath, asthma , Current Smoker   Pulmonary exam normal        Cardiovascular Exercise Tolerance: Good (-) angina negative cardio ROS Normal cardiovascular exam     Neuro/Psych negative neurological ROS  negative psych ROS   GI/Hepatic Neg liver ROS,GERD  Controlled,,  Endo/Other  negative endocrine ROS    Renal/GU negative Renal ROS  negative genitourinary   Musculoskeletal   Abdominal   Peds  Hematology negative hematology ROS (+)   Anesthesia Other Findings Past Medical History: No date: Asthma No date: Chronic back pain No date: Irregular heart beat No date: Pancreatitis No date: Proctocolitis  Past Surgical History: 08/18/2020: INCISION AND DRAINAGE ABSCESS; N/A     Comment:  Procedure: INCISION AND DRAINAGE ABSCESS, perirectal               abscess;  Surgeon: Campbell Lerner, MD;  Location: [facility]               ORS;  Service: General;  Laterality: N/A; 2002: WISDOM TOOTH EXTRACTION; Bilateral     Reproductive/Obstetrics negative OB ROS                             Anesthesia Physical Anesthesia Plan  ASA: 2  Anesthesia Plan: General   Post-op Pain Management:    Induction: Intravenous  PONV Risk Score and Plan: Propofol infusion and TIVA  Airway Management Planned: Natural Airway and Nasal Cannula  Additional Equipment:   Intra-op Plan:   Post-operative Plan:   Informed Consent: I have reviewed the patients History and Physical, chart, labs and discussed the procedure including  the risks, benefits and alternatives for the proposed anesthesia with the patient or authorized representative who has indicated his/her understanding and acceptance.     Dental Advisory Given  Plan Discussed with: Anesthesiologist, CRNA and Surgeon  Anesthesia Plan Comments: (Patient consented for risks of anesthesia including but not limited to:  - adverse reactions to medications - risk of airway placement if required - damage to eyes, teeth, lips or other oral mucosa - nerve damage due to positioning  - sore throat or hoarseness - Damage to heart, brain, nerves, lungs, other parts of body or loss of life  Patient voiced understanding.)       Anesthesia Quick Evaluation

## 2023-05-20 NOTE — H&P (Signed)
Midge Minium, MD [facility] [address] [place], [place] [address] Phone:[phone] Fax : [phone]  Primary Care Physician:  Jerrol Banana, MD Primary Gastroenterologist:  Dr. Servando Snare  Pre-Procedure History & Physical: HPI:  Randall Shelton is a 42 y.o. male is here for an colonoscopy.   Past Medical History:  Diagnosis Date   Asthma    Chronic back pain    Irregular heart beat    Pancreatitis    Proctocolitis     Past Surgical History:  Procedure Laterality Date   INCISION AND DRAINAGE ABSCESS N/A 08/18/2020   Procedure: INCISION AND DRAINAGE ABSCESS, perirectal abscess;  Surgeon: Campbell Lerner, MD;  Location: [facility] ORS;  Service: General;  Laterality: N/A;   WISDOM TOOTH EXTRACTION Bilateral 2002    Prior to Admission medications   Medication Sig Start Date End Date Taking? Authorizing Provider  Albuterol-Budesonide (AIRSUPRA) 90-80 MCG/ACT AERO Inhale 2 Inhalations into the lungs as needed. Maximum 12 puffs/day. 03/18/23  Yes Ferol Luz, MD  budesonide-formoterol [facility]) 80-4.5 MCG/ACT inhaler Inhale 2 puffs into the lungs in the morning and at bedtime. Start at the onset of upper respiratory illness/asthma flare and use 2 puffs twice daily for 2 weeks or until symptoms resolve. 03/18/23   Ferol Luz, MD  pantoprazole (PROTONIX) 20 MG tablet Take 1 tablet (20 mg total) by mouth daily. Patient not taking: Reported on 05/12/2023 03/18/23   Ferol Luz, MD    Allergies as of 05/12/2023   (Not on File)    Family History  Problem Relation Age of Onset   COPD Mother        Smoker   Hiatal hernia Mother    Heart failure Father    Heart disease Father    Hypertension Father    Diverticulitis Father    Bone cancer Father    Seizures Sister    Heart disease Sister    Diabetes Mellitus II Maternal Grandmother    Pneumonia Maternal Grandmother    Cancer Maternal Grandfather    Heart attack Paternal Grandmother    Pneumonia Paternal  Grandfather    High Cholesterol Son     Social History   Socioeconomic History   Marital status: Single    Spouse name: Not on file   Number of children: 1   Years of education: 9   Highest education level: 9th grade  Occupational History   Not on file  Tobacco Use   Smoking status: Every Day    Current packs/day: 1.00    Average packs/day: 1 pack/day for 25.0 years (25.0 ttl pk-yrs)    Types: Cigarettes   Smokeless tobacco: Never  Vaping Use   Vaping status: Former  Substance and Sexual Activity   Alcohol use: Yes   Drug use: Yes    Types: Marijuana   Sexual activity: Not Currently    Birth control/protection: Condom  Other Topics Concern   Not on file  Social History Narrative   Not on file   Social Determinants of Health   Financial Resource Strain: Not on file  Food Insecurity: No Food Insecurity (01/28/2023)   Hunger Vital Sign    Worried About Running Out of Food in the Last Year: Never true    Ran Out of Food in the Last Year: Never true  Transportation Needs: No Transportation Needs (01/28/2023)   PRAPARE - Administrator, Civil Service (Medical): No    Lack of Transportation (Non-Medical): No  Physical Activity: Not on file  Stress: Not on file  Social Connections: Not on file  Intimate Partner Violence: Not At Risk (01/28/2023)   Humiliation, Afraid, Rape, and Kick questionnaire    Fear of Current or Ex-Partner: No    Emotionally Abused: No    Physically Abused: No    Sexually Abused: No    Review of Systems: See HPI, otherwise negative ROS  Physical Exam: BP 106/81   Pulse 66   Temp (!) 96.4 F (35.8 C) (Temporal)   Resp 19   Ht 6\' 1"  (1.854 m)   Wt 90.7 kg   SpO2 95%   BMI 26.39 kg/m  General:   Alert,  pleasant and cooperative in NAD Head:  Normocephalic and atraumatic. Neck:  Supple; no masses or thyromegaly. Lungs:  Clear throughout to auscultation.    Heart:  Regular rate and rhythm. Abdomen:  Soft, nontender and  nondistended. Normal bowel sounds, without guarding, and without rebound.   Neurologic:  Alert and  oriented x4;  grossly normal neurologically.  Impression/Plan: Randall Shelton is here for an colonoscopy to be performed for change in bowel habits  Risks, benefits, limitations, and alternatives regarding  colonoscopy have been reviewed with the patient.  Questions have been answered.  All parties agreeable.   Midge Minium, MD  05/20/2023, 9:41 AM

## 2023-05-20 NOTE — Anesthesia Procedure Notes (Signed)
Procedure Name: General with mask airway Date/Time: 05/20/2023 10:04 AM  Performed by: Mohammed Kindle, CRNAPre-anesthesia Checklist: Patient identified, Emergency Drugs available, Suction available and Patient being monitored Patient Re-evaluated:Patient Re-evaluated prior to induction Oxygen Delivery Method: Simple face mask Induction Type: IV induction Placement Confirmation: positive ETCO2, CO2 detector and breath sounds checked- equal and bilateral Dental Injury: Teeth and Oropharynx as per pre-operative assessment

## 2023-05-21 ENCOUNTER — Encounter: Payer: Self-pay | Admitting: Gastroenterology

## 2023-05-29 ENCOUNTER — Telehealth: Payer: Self-pay

## 2023-05-29 NOTE — Telephone Encounter (Signed)
Pathology result scanned into chart, but I do not think it was ever forwarded to you

## 2023-05-30 ENCOUNTER — Encounter: Payer: Self-pay | Admitting: Gastroenterology

## 2023-06-12 DIAGNOSIS — H3560 Retinal hemorrhage, unspecified eye: Secondary | ICD-10-CM | POA: Diagnosis not present

## 2023-06-21 ENCOUNTER — Emergency Department: Payer: BC Managed Care – PPO

## 2023-06-21 ENCOUNTER — Other Ambulatory Visit: Payer: Self-pay

## 2023-06-21 ENCOUNTER — Emergency Department
Admission: EM | Admit: 2023-06-21 | Discharge: 2023-06-22 | Disposition: A | Discharge status: Home / Self Care | Payer: BC Managed Care – PPO | Attending: Emergency Medicine | Admitting: Emergency Medicine

## 2023-06-21 ENCOUNTER — Ambulatory Visit
Admission: EM | Admit: 2023-06-21 | Discharge: 2023-06-21 | Disposition: A | Discharge status: Home / Self Care | Payer: BC Managed Care – PPO | Attending: Emergency Medicine | Admitting: Emergency Medicine

## 2023-06-21 DIAGNOSIS — K611 Rectal abscess: Secondary | ICD-10-CM

## 2023-06-21 DIAGNOSIS — R109 Unspecified abdominal pain: Secondary | ICD-10-CM | POA: Diagnosis not present

## 2023-06-21 DIAGNOSIS — K610 Anal abscess: Secondary | ICD-10-CM | POA: Diagnosis not present

## 2023-06-21 DIAGNOSIS — R932 Abnormal findings on diagnostic imaging of liver and biliary tract: Secondary | ICD-10-CM | POA: Diagnosis not present

## 2023-06-21 LAB — COMPREHENSIVE METABOLIC PANEL
ALT: 14 U/L (ref 0–44)
AST: 11 U/L — ABNORMAL LOW (ref 15–41)
Albumin: 3.7 g/dL (ref 3.5–5.0)
Alkaline Phosphatase: 57 U/L (ref 38–126)
Anion gap: 11 (ref 5–15)
BUN: 14 mg/dL (ref 6–20)
CO2: 24 mmol/L (ref 22–32)
Calcium: 8.9 mg/dL (ref 8.9–10.3)
Chloride: 103 mmol/L (ref 98–111)
Creatinine, Ser: 1.05 mg/dL (ref 0.61–1.24)
GFR, Estimated: >60 mL/min (ref >60)
Glucose, Bld: 105 mg/dL — ABNORMAL HIGH (ref 70–99)
Potassium: 3.5 mmol/L (ref 3.5–5.1)
Sodium: 138 mmol/L (ref 135–145)
Total Bilirubin: 0.7 mg/dL (ref 0.3–1.2)
Total Protein: 6.8 g/dL (ref 6.5–8.1)

## 2023-06-21 LAB — CBC WITH DIFFERENTIAL/PLATELET
Abs Immature Granulocytes: 0.1 10*3/uL — ABNORMAL HIGH (ref 0.00–0.07)
Basophils Absolute: 0.1 10*3/uL (ref 0.0–0.1)
Basophils Relative: 1 %
Eosinophils Absolute: 0.3 10*3/uL (ref 0.0–0.5)
Eosinophils Relative: 2 %
HCT: 38.1 % — ABNORMAL LOW (ref 39.0–52.0)
Hemoglobin: 12.9 g/dL — ABNORMAL LOW (ref 13.0–17.0)
Immature Granulocytes: 1 %
Lymphocytes Relative: 20 %
Lymphs Abs: 2.7 10*3/uL (ref 0.7–4.0)
MCH: 29.5 pg (ref 26.0–34.0)
MCHC: 33.9 g/dL (ref 30.0–36.0)
MCV: 87.2 fL (ref 80.0–100.0)
Monocytes Absolute: 1.1 10*3/uL — ABNORMAL HIGH (ref 0.1–1.0)
Monocytes Relative: 8 %
Neutro Abs: 9.4 10*3/uL — ABNORMAL HIGH (ref 1.7–7.7)
Neutrophils Relative %: 68 %
Platelets: 218 10*3/uL (ref 150–400)
RBC: 4.37 MIL/uL (ref 4.22–5.81)
RDW: 13.1 % (ref 11.5–15.5)
WBC: 13.7 10*3/uL — ABNORMAL HIGH (ref 4.0–10.5)
nRBC: 0 % (ref 0.0–0.2)

## 2023-06-21 MED ORDER — AMOXICILLIN-POT CLAVULANATE 875-125 MG PO TABS
1.0000 | ORAL_TABLET | Freq: Two times a day (BID) | ORAL | 0 refills | Status: DC
Start: 2023-06-21 — End: 2023-07-21

## 2023-06-21 MED ORDER — ONDANSETRON HCL 4 MG/2ML IJ SOLN
4.0000 mg | Freq: Once | INTRAMUSCULAR | Status: AC
  Administered 2023-06-21: 4 mg via INTRAVENOUS
  Filled 2023-06-21: qty 2

## 2023-06-21 MED ORDER — IBUPROFEN 600 MG PO TABS
600.0000 mg | ORAL_TABLET | Freq: Four times a day (QID) | ORAL | 0 refills | Status: AC | PRN
Start: 2023-06-21 — End: ?

## 2023-06-21 MED ORDER — IOHEXOL 300 MG/ML  SOLN
100.0000 mL | Freq: Once | INTRAMUSCULAR | Status: AC | PRN
  Administered 2023-06-21: 100 mL via INTRAVENOUS

## 2023-06-21 MED ORDER — MORPHINE SULFATE (PF) 4 MG/ML IV SOLN
4.0000 mg | Freq: Once | INTRAVENOUS | Status: AC
  Administered 2023-06-21: 4 mg via INTRAVENOUS
  Filled 2023-06-21: qty 1

## 2023-06-21 NOTE — Discharge Instructions (Addendum)
Follow up with your primary care provider, GI specialist, or general surgeon on Monday.    Go to the emergency department if you have worsening symptoms.   Take the Augmentin and ibuprofen as directed.

## 2023-06-21 NOTE — ED Provider Notes (Signed)
Renaldo Fiddler    CSN: [id number] Arrival date & time: 06/21/23  1239      History   Chief Complaint Chief Complaint  Patient presents with   Abscess    HPI ADARIAN GOTWALT is a 42 y.o. male.  Patient presents with 3-4 day history of perirectal pain, swelling, purulent drainage.  These symptoms are similar to previous episode of perirectal abscess.  He has been treating with warm soaks.  He denies fever, chills, abdominal pain, blood in stool, or other symptoms.  His medical history includes perirectal abscess in 2022; he was seen by general surgeon Dr. Claudine Mouton at that time.  He was seen by gastroenterologist Dr. Servando Snare on 05/12/2023; diagnosed with change in bowel habits.  He had a colonoscopy on 05/20/2023.  The history is provided by the patient and medical records.    Past Medical History:  Diagnosis Date   Asthma    Chronic back pain    Irregular heart beat    Pancreatitis    Proctocolitis     Patient Active Problem List   Diagnosis Date Noted   Change in bowel habits 05/20/2023   Polyp of sigmoid colon 05/20/2023   Cystic fibrosis screening 01/28/2023   Seasonal and perennial allergic rhinitis 12/05/2022   Gastroesophageal reflux disease 12/05/2022   Alcohol intolerance 12/05/2022   Tobacco abuse 10/30/2022   Incidental lung nodule 10/30/2022   Acute non-recurrent sinusitis 10/01/2022   Patellar tendinitis, left knee 03/04/2022   Family history of hypertrophic cardiomyopathy 03/04/2022   Annual physical exam 05/02/2021   Carbuncle of head except face 05/02/2021   Mild persistent asthma without complication 05/02/2021   Abrasion of right forearm 04/04/2021   Plantar fasciitis, left 04/04/2021   Tobacco abuse counseling 04/04/2021   Follow-up examination following surgery 10/24/2020   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   Perirectal abscess 12/02/2019   Chronic back pain    Irregular heart beat    Proctocolitis    Pancreatitis     Past  Surgical History:  Procedure Laterality Date   BIOPSY  05/20/2023   Procedure: BIOPSY;  Surgeon: Midge Minium, MD;  Location: [facility] ENDOSCOPY;  Service: Endoscopy;;   COLONOSCOPY WITH PROPOFOL N/A 05/20/2023   Procedure: COLONOSCOPY WITH PROPOFOL;  Surgeon: Midge Minium, MD;  Location: [facility] ENDOSCOPY;  Service: Endoscopy;  Laterality: N/A;   INCISION AND DRAINAGE ABSCESS N/A 08/18/2020   Procedure: INCISION AND DRAINAGE ABSCESS, perirectal abscess;  Surgeon: Campbell Lerner, MD;  Location: [facility] ORS;  Service: General;  Laterality: N/A;   POLYPECTOMY  05/20/2023   Procedure: POLYPECTOMY;  Surgeon: Midge Minium, MD;  Location: [facility] ENDOSCOPY;  Service: Endoscopy;;   WISDOM TOOTH EXTRACTION Bilateral 2002       Home Medications    Prior to Admission medications   Medication Sig Start Date End Date Taking? Authorizing Provider  amoxicillin-clavulanate (AUGMENTIN) 875-125 MG tablet Take 1 tablet by mouth every 12 (twelve) hours. 06/21/23  Yes Mickie Bail, NP  ibuprofen (ADVIL) 600 MG tablet Take 1 tablet (600 mg total) by mouth every 6 (six) hours as needed. 06/21/23  Yes Mickie Bail, NP  Albuterol-Budesonide (AIRSUPRA) 90-80 MCG/ACT AERO Inhale 2 Inhalations into the lungs as needed. Maximum 12 puffs/day. 03/18/23   Ferol Luz, MD  budesonide-formoterol (SYMBICORT) 80-4.5 MCG/ACT inhaler Inhale 2 puffs into the lungs in the morning and at bedtime. Start at the onset of upper respiratory illness/asthma flare and use 2 puffs twice daily for 2 weeks or until symptoms resolve. 03/18/23  Ferol Luz, MD  pantoprazole (PROTONIX) 20 MG tablet Take 1 tablet (20 mg total) by mouth daily. 03/18/23   Ferol Luz, MD    Family History Family History  Problem Relation Age of Onset   COPD Mother        Smoker   Hiatal hernia Mother    Heart failure Father    Heart disease Father    Hypertension Father    Diverticulitis Father    Bone cancer Father    Seizures Sister    Heart disease  Sister    Diabetes Mellitus II Maternal Grandmother    Pneumonia Maternal Grandmother    Cancer Maternal Grandfather    Heart attack Paternal Grandmother    Pneumonia Paternal Grandfather    High Cholesterol Son     Social History Social History   Tobacco Use   Smoking status: Every Day    Current packs/day: 1.00    Average packs/day: 1 pack/day for 25.0 years (25.0 ttl pk-yrs)    Types: Cigarettes   Smokeless tobacco: Never  Vaping Use   Vaping status: Former  Substance Use Topics   Alcohol use: Yes   Drug use: Yes    Types: Marijuana     Allergies   Patient has no known allergies.   Review of Systems Review of Systems  Constitutional:  Negative for chills and fever.  Gastrointestinal:  Negative for abdominal pain, anal bleeding, blood in stool, diarrhea and vomiting.       Perirectal pain, purulent drainage, swelling.     Physical Exam Triage Vital Signs ED Triage Vitals  Encounter Vitals Group     BP 06/21/23 1255 109/72     Systolic BP Percentile --      Diastolic BP Percentile --      Pulse Rate 06/21/23 1250 91     Resp 06/21/23 1250 18     Temp 06/21/23 1250 98.6 F (37 C)     Temp src --      SpO2 06/21/23 1250 96 %     Weight --      Height --      Head Circumference --      Peak Flow --      Pain Score 06/21/23 1254 7     Pain Loc --      Pain Education --      Exclude from Growth Chart --    No data found.  Updated Vital Signs BP 109/72   Pulse 91   Temp 98.6 F (37 C)   Resp 18   SpO2 96%   Visual Acuity Right Eye Distance:   Left Eye Distance:   Bilateral Distance:    Right Eye Near:   Left Eye Near:    Bilateral Near:     Physical Exam Exam conducted with a chaperone present Higher education careers adviser).  Constitutional:      General: He is not in acute distress. HENT:     Mouth/Throat:     Mouth: Mucous membranes are moist.  Cardiovascular:     Rate and Rhythm: Normal rate and regular rhythm.     Heart sounds: Normal heart sounds.   Pulmonary:     Effort: Pulmonary effort is normal. No respiratory distress.     Breath sounds: Normal breath sounds.  Abdominal:     General: Bowel sounds are normal.     Palpations: Abdomen is soft.     Tenderness: There is no abdominal tenderness. There is no guarding or rebound.  Genitourinary:    Rectum: Mass present.    Neurological:     Mental Status: He is alert.  Psychiatric:        Mood and Affect: Mood normal.        Behavior: Behavior normal.      UC Treatments / Results  Labs (all labs ordered are listed, but only abnormal results are displayed) Labs Reviewed - No data to display  EKG   Radiology No results found.  Procedures Procedures (including critical care time)  Medications Ordered in UC Medications - No data to display  Initial Impression / Assessment and Plan / UC Course  I have reviewed the triage vital signs and the nursing notes.  Pertinent labs & imaging results that were available during my care of the patient were reviewed by me and considered in my medical decision making (see chart for details).    Perirectal abscess.  Patient declines transfer to the ED.  Afebrile and vital signs are stable.  Treating with Augmentin.  Per patient request, also prescribed ibuprofen for discomfort.  Instructed him to follow-up with his general surgeon Dr. Claudine Mouton or his PCP or his GI doctor on Monday.  Strict ED precautions discussed at length.  Education provided on anorectal abscess.  Patient agrees to plan of care.  Final Clinical Impressions(s) / UC Diagnoses   Final diagnoses:  Perirectal abscess     Discharge Instructions      Follow up with your primary care provider, GI specialist, or general surgeon on Monday.    Go to the emergency department if you have worsening symptoms.   Take the Augmentin and ibuprofen as directed.         ED Prescriptions     Medication Sig Dispense Auth. Provider   amoxicillin-clavulanate (AUGMENTIN)  875-125 MG tablet Take 1 tablet by mouth every 12 (twelve) hours. 14 tablet Wendee Beavers H, NP   ibuprofen (ADVIL) 600 MG tablet Take 1 tablet (600 mg total) by mouth every 6 (six) hours as needed. 30 tablet Mickie Bail, NP      PDMP not reviewed this encounter.   Mickie Bail, NP 06/21/23 1359

## 2023-06-21 NOTE — ED Triage Notes (Signed)
Patient to Urgent Care with complaints of abscess beside his rectum. Reports hx of several in the past.   Symptoms started four days ago. No drainage. Has been doing hot soaks but unable to express anything.

## 2023-06-21 NOTE — ED Triage Notes (Signed)
Pt reports perianal abscess with hx of same. Pt reports abscess since Wednesday, and has had increased pain. Pt was seen at [facility] today and given oral abx, has taken one dose today and came to ER due to increased pain.

## 2023-06-22 MED ORDER — HYDROCODONE-ACETAMINOPHEN 5-325 MG PO TABS: 1.0000 | ORAL_TABLET | ORAL | 0 refills | Status: DC | PRN

## 2023-06-22 NOTE — Discharge Instructions (Addendum)
You were evaluated in the ED for perirectal abscess.  Your lab work is reassuring with slightly elevated WBC.  CT scan revealed a 2.5 cm perirectal abscess.  You will need to follow-up with Dr. Claudine Mouton on Monday morning.  Call and schedule appointment.  Take the Augmentin that urgent care prescribed till dose is complete.  Continues sitz baths and warm compress over affected area to reduce drainage.

## 2023-06-22 NOTE — ED Provider Notes (Signed)
[facility] Emergency Department Provider Note     Event Date/Time   First MD Initiated Contact with Patient 06/21/23 2115     (approximate)   History   Abscess   HPI  Randall Shelton is a 42 y.o. male with a history of perirectal abscess presents to the ED after evaluation at urgent care for a perirectal abscess that has since increased in pain.  Patient reports he noticed abscess x 3 days.  He has tried warm sitz bath's, Tylenol and ibuprofen with minimal relief.  He denies fever and chills.  No rectal bleeding.  Patient reports history of perirectal abscess on contralateral side of presenting abscess a few years ago. Patient had to abscess surgically removed by Dr. Claudine Mouton of general surgery.  Patient reports this abscess is similar.    Physical Exam   Triage Vital Signs: ED Triage Vitals  Encounter Vitals Group     BP 06/21/23 2051 123/78     Systolic BP Percentile --      Diastolic BP Percentile --      Pulse Rate 06/21/23 2051 83     Resp 06/21/23 2051 15     Temp 06/21/23 2051 98.6 F (37 C)     Temp Source 06/21/23 2051 Oral     SpO2 06/21/23 2051 95 %     Weight 06/21/23 2050 202 lb (91.6 kg)     Height 06/21/23 2050 6\' 1"  (1.854 m)     Head Circumference --      Peak Flow --      Pain Score 06/21/23 2050 8     Pain Loc --      Pain Education --      Exclude from Growth Chart --     Most recent vital signs: Vitals:   06/21/23 2051  BP: 123/78  Pulse: 83  Resp: 15  Temp: 98.6 F (37 C)  SpO2: 95%    General Awake, no distress.  Nontoxic. HEENT NCAT. PERRL. EOMI. No rhinorrhea. Mucous membranes are moist.  CV:  Good peripheral perfusion.  RESP:  Normal effort.  ABD:  No distention.  Other:  Digital rectal exam performed.  There is a moderate-sized indurated abscess on the lower right perirectal region.  No hemorrhoids or fistulas noted.  No fluctuant or drainage noted.   ED Results / Procedures / Treatments   Labs (all  labs ordered are listed, but only abnormal results are displayed) Labs Reviewed  CBC WITH DIFFERENTIAL/PLATELET - Abnormal; Notable for the following components:      Result Value   WBC 13.7 (*)    Hemoglobin 12.9 (*)    HCT 38.1 (*)    Neutro Abs 9.4 (*)    Monocytes Absolute 1.1 (*)    Abs Immature Granulocytes 0.10 (*)    All other components within normal limits  COMPREHENSIVE METABOLIC PANEL - Abnormal; Notable for the following components:   Glucose, Bld 105 (*)    AST 11 (*)    All other components within normal limits    RADIOLOGY  I personally viewed and evaluated these images as part of my medical decision making, as well as reviewing the written report by the radiologist.  ED Provider Interpretation: Large abscess is identified in the perirectal region.  Will confirm with final radiology read.  CT ABDOMEN PELVIS W CONTRAST  Result Date: 06/21/2023 CLINICAL DATA:  Perirectal abscess, progressive abdominal pain EXAM: CT ABDOMEN AND PELVIS WITH CONTRAST TECHNIQUE: Multidetector CT  imaging of the abdomen and pelvis was performed using the standard protocol following bolus administration of intravenous contrast. RADIATION DOSE REDUCTION: This exam was performed according to the departmental dose-optimization program which includes automated exposure control, adjustment of the mA and/or kV according to patient size and/or use of iterative reconstruction technique. CONTRAST:  OMNIPAQUE IOHEXOL 300 MG/ML  SOLN COMPARISON:  12/02/2019 FINDINGS: Lower chest: No acute abnormality. Hepatobiliary: Stable probable cyst within the right hepatic lobe. Liver otherwise unremarkable. No intra or extrahepatic biliary ductal dilation. Gallbladder unremarkable. Pancreas: Unremarkable Spleen: Unremarkable Adrenals/Urinary Tract: Adrenal glands are unremarkable. Kidneys are normal, without renal calculi, focal lesion, or hydronephrosis. Bladder is unremarkable. Stomach/Bowel: There is mild bowel  wall thickening involving the proximal jejunum, best appreciated on coronal image # 16/5 and axial image # 39/2 suggesting changes of a long segment infectious or inflammatory enteritis as can be seen with inflammatory bowel disease, celiac sprue or eosinophilic enteritis. A a right perianal subcutaneous rim enhancing fluid collection is seen on axial image # 97/2 and coronal image # 71/5 in keeping with a perianal abscess measuring 2.0 x 2.5 x 2.5 cm. Mild surrounding inflammatory change. Inflammatory changes are well below the level of the levator ani. The stomach, small bowel, and large bowel are otherwise unremarkable. Appendix normal. No free intraperitoneal gas or fluid. Vascular/Lymphatic: Mild aortoiliac atherosclerotic calcification. No aortic aneurysm. No pathologic adenopathy within the abdomen and pelvis. Reproductive: Prostate is unremarkable. Other: No abdominal wall hernia or abnormality. No abdominopelvic ascites. Musculoskeletal: No acute or significant osseous findings. IMPRESSION: 1. 2.5 cm right perianal abscess. 2. Mild proximal small bowel infectious or inflammatory enteritis. No obstruction or perforation. Aortic Atherosclerosis (ICD10-I70.0). Electronically Signed   By: Helyn Numbers M.D.   On: 06/21/2023 23:33    PROCEDURES:  Critical Care performed: No  Procedures   MEDICATIONS ORDERED IN ED: Medications  morphine (PF) 4 MG/ML injection 4 mg (4 mg Intravenous Given 06/21/23 2217)  ondansetron (ZOFRAN) injection 4 mg (4 mg Intravenous Given 06/21/23 2219)  iohexol (OMNIPAQUE) 300 MG/ML solution 100 mL (100 mLs Intravenous Contrast Given 06/21/23 2303)     IMPRESSION / MDM / ASSESSMENT AND PLAN / ED COURSE  I reviewed the triage vital signs and the nursing notes.                               42 y.o. male presents to the emergency department for evaluation and treatment of perirectal abscess. See HPI for further details.   Differential diagnosis includes, but is not  limited to perirectal abscess, fistula, hemorrhoid  Patient's presentation is most consistent with acute presentation with potential threat to life or bodily function.  Patient is nontoxic-appearing and afebrile.  Presentation is clinically consistent with perirectal abscess confirmed by CT with contrast of abdomen pelvis.  Labs are reassuring with the exception of an elevated WBC of 13.7.  patient was evaluated in urgent care before ED arrival.  He was given Augmentin and ibuprofen for his pain.  I will provide hydrocodone for pain and patient is encouraged to complete Augmentin dosage.  Patient is to follow-up with Dr. Dolores Frame on Monday for further evaluation. Patient is given ED precautions to return to the ED for any worsening or new symptoms. Patient verbalizes understanding. All questions and concerns were addressed during ED visit.     FINAL CLINICAL IMPRESSION(S) / ED DIAGNOSES   Final diagnoses:  Perirectal abscess   Rx /  DC Orders   ED Discharge Orders          Ordered    HYDROcodone-acetaminophen (NORCO/VICODIN) 5-325 MG tablet  Every 4 hours PRN        06/22/23 0005             Note:  This document was prepared using Dragon voice recognition software and may include unintentional dictation errors.    Romeo Apple, Skyelyn Scruggs A, PA-C 06/22/23 0016    Janith Lima, MD 06/23/23 [phone]

## 2023-07-01 ENCOUNTER — Ambulatory Visit: Payer: BC Managed Care – PPO | Admitting: Surgery

## 2023-07-21 ENCOUNTER — Ambulatory Visit: Payer: BC Managed Care – PPO | Admitting: Internal Medicine

## 2023-07-21 ENCOUNTER — Encounter: Payer: Self-pay | Admitting: Internal Medicine

## 2023-07-21 VITALS — BP 90/64 | HR 89 | Temp 97.7°F | Resp 18

## 2023-07-21 DIAGNOSIS — K219 Gastro-esophageal reflux disease without esophagitis: Secondary | ICD-10-CM

## 2023-07-21 DIAGNOSIS — J3089 Other allergic rhinitis: Secondary | ICD-10-CM | POA: Diagnosis not present

## 2023-07-21 DIAGNOSIS — J4489 Other specified chronic obstructive pulmonary disease: Secondary | ICD-10-CM

## 2023-07-21 DIAGNOSIS — J302 Other seasonal allergic rhinitis: Secondary | ICD-10-CM

## 2023-07-21 DIAGNOSIS — B999 Unspecified infectious disease: Secondary | ICD-10-CM

## 2023-07-21 MED ORDER — PREDNISONE 10 MG PO TABS: ORAL_TABLET | ORAL | 0 refills | Status: DC

## 2023-07-21 MED ORDER — AMOXICILLIN-POT CLAVULANATE 875-125 MG PO TABS: 1.0000 | ORAL_TABLET | Freq: Two times a day (BID) | ORAL | 0 refills | Status: AC

## 2023-07-21 NOTE — Progress Notes (Signed)
Follow Up Note  RE: Randall Shelton MRN: [id number] DOB: 21-Dec-1980 Date of Office Visit: 07/21/2023  Referring provider: Jerrol Banana, MD Primary care provider: Jerrol Banana, MD  Chief Complaint: No chief complaint on file.  History of Present Illness: I had the pleasure of seeing Randall Shelton for a follow up visit at the [facility] on 07/29/2023. He is a 42 y.o. male, who is being followed for persistent asthma, allergic rhinitis. His previous allergy office visit was on 12/05/22 with Dr. Maurine Minister. Today is a regular follow up visit.  History obtained from patient, chart review.  The patient, with a history of asthma, COPD, and pancreatic insufficiency, presents with respiratory symptoms that began last Thursday. They report nasal congestion and a productive cough, with sputum that is thicker than usual. They have been self-treating with Mucinex, which seems to be helping. They deny fever, ear pain, and any difficulty with ear pressure equalization. They also report shortness of breath and wheezing. They have been taking Symbicort, but only once daily in the morning, instead of the prescribed twice daily.  The patient also has a history of recurrent abscesses, the most recent of which required an ER visit. They have been evaluated by a GI specialist for pancreatic insufficiency, with a recent enzyme test showing a count over 800, bring the diagnosis into question. . They have also undergone a colonoscopy, which revealed a couple of polyps but no other abnormalities. The patient has had other skin infections in the past, including an abscess due to retained fish fin from fishing injury.   The patient also mentions a persistent spot on their face, diagnosed as cystic acne by a dermatologist. They have declined long-term antibiotic treatment for this due to concerns about their stomach. They report that abscesses occur every three to six months, sometimes requiring  antibiotics if they do not drain well on their own.  Assessment and Plan: Imere is a 42 y.o. male with: Recurrent infections - Plan: Immunoglobulins, QN, A/E/G/M, Complement, total, Lymph Enumeration, Basic & NK Cells, Strep pneumoniae 23 Serotypes IgG, Diphtheria / Tetanus Antibody Panel, Spirometry with Graph  Asthma-COPD overlap syndrome (HCC)  Gastroesophageal reflux disease without esophagitis  Seasonal and perennial allergic rhinitis   Plan: Patient Instructions  Mild Persistent Asthma with possible COPD overlap, + VCD overlap: with acut exacerbation  - Start Augmentin 875/125mg  twice a day for 7 days  - Prednisone 10mg  : Take 2 tablets twice a day for 3 days, Then take 2 tablets once a day for 1 day., then take 1 tablet once a day for 1 day.    - Controller Inhaler: Continue Symbicort 80 mcg 2 puffs twice a day;  Use daily  - Rinse mouth out after use - Rescue Inhaler:  Airsupra . Use 2-4 puffs  as needed for chest tightness, wheezing, or coughing.  Can also use 15 minutes prior to exercise if you have symptoms with activity. Maximum 12 puffs/day - Asthma is not controlled if:  - Symptoms are occurring >2 times a week OR  - >2 times a month nighttime awakenings  - You are requiring systemic steroids (prednisone/steroid injections) more than once per year  - Your require hospitalization for your asthma.  - Please call the clinic to schedule a follow up if these symptoms arise - continue working towards quitting smoking   Chronic Rhinitis Seasonal and Perennial Allergic:    - Prevention:  - allergen avoidance when possible  -  Symptom control: - Continue Astelin (azelastine) 1-2 sprays in each nostril twice a day as needed for nasal congestion/itchy nose - Continue Antihistamine: daily or daily as needed.   -Options include Zyrtec (Cetirizine) 10mg , Claritin (Loratadine) 10mg , Allegra (Fexofenadine) 180mg , or Xyzal (Levocetirinze) 5mg  - Can be purchased over-the-counter  if not covered by insurance. - continue daily saline nasal rinse   Food allergy/intolerance:  - continue avoidance of alcohol  In case of SKIN  (hives) + ANY additional symptoms (vomiting, diarrhea, nausea, trouble breathing, coughing), OR IF concern for LIFE THREATENING reaction = Epipen Autoinjector EpiPen 0.3 mg. - If using Epinephrine autoinjector, call 911  GERD:  - diet and lifesyle modifications as below - Continue  pantoprazole 20mg  daily   Recurrent Infections  -Keep track of any and all infections including abscesses and sinopulmonary infections  Follow up: 3-6 months   Thank you so much for letting me partake in your care today.  Don't hesitate to reach out if you have any additional concerns!  Ferol Luz, MD  [facility] [place] [place]       Meds ordered this encounter  Medications   predniSONE (DELTASONE) 10 MG tablet    Sig: Prednisone 10mg  : Take 2 tablets twice a day for 3 days, Then take 2 tablets once a day for 1 day., then take 1 tablet once a day for 1 day.    Dispense:  15 tablet    Refill:  0   amoxicillin-clavulanate (AUGMENTIN) 875-125 MG tablet    Sig: Take 1 tablet by mouth 2 (two) times daily for 7 days.    Dispense:  14 tablet    Refill:  0    Lab Orders         Immunoglobulins, QN, A/E/G/M         Complement, total         Lymph Enumeration, Basic & NK Cells         Strep pneumoniae 23 Serotypes IgG         Diphtheria / Tetanus Antibody Panel     Diagnostics: Spirometry:  Tracings reviewed. His effort: Good reproducible efforts. FVC: 4.10 L FEV1: 3.15 L, 71% predicted FEV1/FVC ratio: 77% Interpretation: Spirometry consistent with mixed obstructive and restrictive disease.  Improved from prior. please see scanned spirometry results for details.    Medication List:  Current Outpatient Medications  Medication Sig Dispense Refill   Albuterol-Budesonide (AIRSUPRA) 90-80 MCG/ACT AERO Inhale 2 Inhalations into the  lungs as needed. Maximum 12 puffs/day. 10.7 g 3   budesonide-formoterol (SYMBICORT) 80-4.5 MCG/ACT inhaler Inhale 2 puffs into the lungs in the morning and at bedtime. Start at the onset of upper respiratory illness/asthma flare and use 2 puffs twice daily for 2 weeks or until symptoms resolve. 1 each 5   ibuprofen (ADVIL) 600 MG tablet Take 1 tablet (600 mg total) by mouth every 6 (six) hours as needed. 30 tablet 0   predniSONE (DELTASONE) 10 MG tablet Prednisone 10mg  : Take 2 tablets twice a day for 3 days, Then take 2 tablets once a day for 1 day., then take 1 tablet once a day for 1 day. 15 tablet 0   No current facility-administered medications for this visit.   Allergies: No Known Allergies I reviewed his past medical history, social history, family history, and environmental history and no significant changes have been reported from his previous visit.  ROS: All others negative except as noted per HPI.   Objective: BP 90/64  Pulse 89   Temp 97.7 F (36.5 C) (Temporal)   Resp 18   SpO2 95%  There is no height or weight on file to calculate BMI. General Appearance:  Alert, cooperative, no distress, appears stated age  Head:  Normocephalic, without obvious abnormality, atraumatic  Eyes:  Conjunctiva clear, EOM's intact  Nose: Nares normal,  erythematous nasal mucosa, no visible anterior polyps, and septum midline  Throat: Lips, tongue normal; teeth and gums normal, + cobblestoning  Neck: Supple, symmetrical  Lungs:   clear to auscultation bilaterally, Respirations unlabored, no coughing  Heart:  regular rate and rhythm and no murmur, Appears well perfused  Extremities: No edema  Skin: Skin color, texture, turgor normal, no rashes or lesions on visualized portions of skin  Neurologic: No gross deficits   Previous notes and tests were reviewed. The plan was reviewed with the patient/family, and all questions/concerned were addressed.  It was my pleasure to see Randall Shelton today and  participate in his care. Please feel free to contact me with any questions or concerns.  Sincerely,  Ferol Luz, MD  Allergy & Immunology  [facility] Office: [phone]

## 2023-07-21 NOTE — Patient Instructions (Addendum)
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    - Controller Inhaler: Continue Symbicort 80 mcg 2 puffs twice a day;  Use daily  - Rinse mouth out after use - Rescue Inhaler:  Airsupra . Use 2-4 puffs  as needed for chest tightness, wheezing, or coughing.  Can also use 15 minutes prior to exercise if you have symptoms with activity. Maximum 12 puffs/day - Asthma is not controlled if:  - Symptoms are occurring >2 times a week OR  - >2 times a month nighttime awakenings  - You are requiring systemic steroids (prednisone/steroid injections) more than once per year  - Your require hospitalization for your asthma.  - Please call the clinic to schedule a follow up if these symptoms arise - continue working towards quitting smoking   Chronic Rhinitis Seasonal and Perennial Allergic:    - Prevention:  - allergen avoidance when possible  - Symptom control: - Continue Astelin (azelastine) 1-2 sprays in each nostril twice a day as needed for nasal congestion/itchy nose - Continue Antihistamine: daily or daily as needed.   -Options include Zyrtec (Cetirizine) 10mg , Claritin (Loratadine) 10mg , Allegra (Fexofenadine) 180mg , or Xyzal (Levocetirinze) 5mg  - Can be purchased over-the-counter if not covered by insurance. - continue daily saline nasal rinse   Food allergy/intolerance:  - continue avoidance of alcohol  In case of SKIN  (hives) + ANY additional symptoms (vomiting, diarrhea, nausea, trouble breathing, coughing), OR IF concern for LIFE THREATENING reaction = Epipen Autoinjector EpiPen 0.3 mg. - If using Epinephrine autoinjector, call 911  GERD:  - diet and lifesyle modifications as below - Continue  pantoprazole 20mg  daily   Recurrent Infections   -Keep track of any and all infections including abscesses and sinopulmonary infections  Follow up: 3-6 months   Thank you so much for letting me partake in your care today.  Don't hesitate to reach out if you have any additional concerns!  Ferol Luz, MD  [facility] [place] [place]

## 2023-11-19 ENCOUNTER — Encounter: Payer: Self-pay | Admitting: Family Medicine

## 2023-11-25 ENCOUNTER — Ambulatory Visit: Payer: BC Managed Care – PPO | Admitting: Internal Medicine

## 2024-01-05 ENCOUNTER — Ambulatory Visit: Payer: BC Managed Care – PPO | Admitting: Internal Medicine

## 2024-01-05 ENCOUNTER — Encounter: Payer: Self-pay | Admitting: Internal Medicine

## 2024-01-05 VITALS — BP 96/60 | HR 69 | Temp 98.2°F | Resp 18 | Ht 71.5 in | Wt 199.0 lb

## 2024-01-05 DIAGNOSIS — B999 Unspecified infectious disease: Secondary | ICD-10-CM | POA: Diagnosis not present

## 2024-01-05 DIAGNOSIS — J3089 Other allergic rhinitis: Secondary | ICD-10-CM

## 2024-01-05 DIAGNOSIS — K219 Gastro-esophageal reflux disease without esophagitis: Secondary | ICD-10-CM

## 2024-01-05 DIAGNOSIS — J4489 Other specified chronic obstructive pulmonary disease: Secondary | ICD-10-CM

## 2024-01-05 DIAGNOSIS — J302 Other seasonal allergic rhinitis: Secondary | ICD-10-CM

## 2024-01-05 DIAGNOSIS — K9049 Malabsorption due to intolerance, not elsewhere classified: Secondary | ICD-10-CM

## 2024-01-05 NOTE — Progress Notes (Signed)
 Follow Up Note  RE: Randall Shelton MRN: [id number] DOB: 26-Mar-1981 Date of Office Visit: 01/05/2024  Referring provider: Jerrol Banana, MD Primary care provider: Jerrol Banana, MD  Chief Complaint: No chief complaint on file.  History of Present Illness: I had the pleasure of seeing Randall Shelton for a follow up visit at the [facility] on 01/05/2024. He is a 43 y.o. male, who is being followed for persistent asthma, allergic rhinitis. His previous allergy office visit was on 07/21/23  with Dr. Marlynn Perking. Today is a regular follow up visit.  History obtained from patient, chart review.  At last visit he was treated for asthma COPD overlap flare with Augmentin and prednisone.  He also reported recurrent abscess infections also some concern for underlying VCD. FEV1: 3.15 L, 71% predicted  The patient, with a history of asthma, COPD, and pancreatic insufficiency, presents with respiratory symptoms that began last Thursday. They report nasal congestion and a productive cough, with sputum that is thicker than usual. They have been self-treating with Mucinex, which seems to be helping. They deny fever, ear pain, and any difficulty with ear pressure equalization. They also report shortness of breath and wheezing. They have been taking Symbicort, but only once daily in the morning, instead of the prescribed twice daily.  The patient also has a history of recurrent abscesses, the most recent of which required an ER visit. They have been evaluated by a GI specialist for pancreatic insufficiency, with a recent enzyme test showing a count over 800, bring the diagnosis into question. . They have also undergone a colonoscopy, which revealed a couple of polyps but no other abnormalities. The patient has had other skin infections in the past, including an abscess due to retained fish fin from fishing injury.   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 They have declined long-term antibiotic treatment for this due to concerns about their stomach. They report that abscesses occur every three to six months, sometimes requiring antibiotics if they do not drain well on their own.  Assessment and Plan: Randall Shelton is a 43 y.o. male with: No diagnosis found.   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    - Controller Inhaler: Continue Symbicort 80 mcg 2 puffs twice a day;  Use daily  - Rinse mouth out after use - Rescue Inhaler:  Airsupra . Use 2-4 puffs  as needed for chest tightness, wheezing, or coughing.  Can also use 15 minutes prior to exercise if you have symptoms with activity. Maximum 12 puffs/day - Asthma is not controlled if:  - Symptoms are occurring >2 times a week OR  - >2 times a month nighttime awakenings  - You are requiring systemic steroids (prednisone/steroid injections) more than once per year  - Your require hospitalization for your asthma.  - Please call the clinic to schedule a follow up if these symptoms arise - continue working towards quitting smoking   Chronic Rhinitis Seasonal and Perennial Allergic:    - Prevention:  - allergen avoidance when possible  - Symptom control: - Continue Astelin (azelastine) 1-2  sprays in each nostril twice a day as needed for nasal congestion/itchy nose - Continue Antihistamine: daily or daily as needed.   -Options include Zyrtec (Cetirizine) 10mg , Claritin (Loratadine) 10mg , Allegra (Fexofenadine) 180mg , or Xyzal (Levocetirinze) 5mg  - Can be purchased over-the-counter if not covered by insurance. - continue daily saline nasal rinse   Food allergy/intolerance:  - continue avoidance of  alcohol  In case of SKIN  (hives) + ANY additional symptoms (vomiting, diarrhea, nausea, trouble breathing, coughing), OR IF concern for LIFE THREATENING reaction = Epipen Autoinjector EpiPen 0.3 mg. - If using Epinephrine autoinjector, call 911  GERD:  - diet and lifesyle modifications as below - Continue  pantoprazole 20mg  daily   Recurrent Infections  -Keep track of any and all infections including abscesses and sinopulmonary infections  Follow up: 3-6 months   Thank you so much for letting me partake in your care today.  Don't hesitate to reach out if you have any additional concerns!  Ferol Luz, MD  [facility] [place], [place]       No orders of the defined types were placed in this encounter.   Lab Orders  No laboratory test(s) ordered today    Diagnostics: Spirometry:  Tracings reviewed. His effort: Good reproducible efforts. FVC: 4.07L FEV1: 2.68 L, 61% predicted FEV1/FVC ratio: 66% Interpretation: Spirometry consistent with mixed obstructive and restrictive disease.   please see scanned spirometry results for details.    Medication List:  Current Outpatient Medications  Medication Sig Dispense Refill   Albuterol-Budesonide (AIRSUPRA) 90-80 MCG/ACT AERO Inhale 2 Inhalations into the lungs as needed. Maximum 12 puffs/day. 10.7 g 3   budesonide-formoterol (SYMBICORT) 80-4.5 MCG/ACT inhaler Inhale 2 puffs into the lungs in the morning and at bedtime. Start at the onset of upper respiratory illness/asthma flare and use 2 puffs twice daily for 2 weeks or until symptoms resolve. 1 each 5   ibuprofen (ADVIL) 600 MG tablet Take 1 tablet (600 mg total) by mouth every 6 (six) hours as needed. 30 tablet 0   No current facility-administered medications for this visit.   Allergies: No Known Allergies I reviewed his past medical history, social history, family history, and environmental history and no significant changes have been reported from his  previous visit.  ROS: All others negative except as noted per HPI.   Objective: There were no vitals taken for this visit. There is no height or weight on file to calculate BMI. General Appearance:  Alert, cooperative, no distress, appears stated age  Head:  Normocephalic, without obvious abnormality, atraumatic  Eyes:  Conjunctiva clear, EOM's intact  Nose: Nares normal,  erythematous nasal mucosa, no visible anterior polyps, and septum midline  Throat: Lips, tongue normal; teeth and gums normal, + cobblestoning  Neck: Supple, symmetrical  Lungs:   clear to auscultation bilaterally, Respirations unlabored, no coughing  Heart:  regular rate and rhythm and no murmur, Appears well perfused  Extremities: No edema  Skin: Skin color, texture, turgor normal, no rashes or lesions on visualized portions of skin  Neurologic: No gross deficits   Previous notes and tests were reviewed. The plan was reviewed with the patient/family, and all questions/concerned were addressed.  It was my pleasure to see Randall Shelton today and participate in his care. Please feel free to contact me with any questions or concerns.  Sincerely,  Ferol Luz, MD  Allergy & Immunology  [facility] Office: [phone]

## 2024-01-05 NOTE — Patient Instructions (Addendum)
 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    - Controller Inhaler: Start Trelegy 1 puff once a day;  Use daily  - Rinse mouth out after use - Samples provided to last this given his change of insurance starting May 1 - Rescue Inhaler:  Airsupra . Use 2-4 puffs  as needed for chest tightness, wheezing, or coughing.  Can also use 15 minutes prior to exercise if you have symptoms with activity. Maximum 12 puffs/day - Asthma is not controlled if:  - Symptoms are occurring >2 times a week OR  - >2 times a month nighttime awakenings  - You are requiring systemic steroids (prednisone/steroid injections) more than once per year  - Your require hospitalization for your asthma.  - Please call the clinic to schedule a follow up if these symptoms arise - continue working towards quitting smoking   Seasonal and Perennial Allergic:  rhinoconjunctivitis   - Prevention:  - allergen avoidance when possible  - Symptom control: - Start Allegra (fexofenadine) 180mg  daily. Can buy cheaply from [employer], [employer], [employer], [employer]  - Continue daily saline nasal rinse  - Use saline nasal spray for hydration of nose  - Start Pataday 0.7% eye drops: 1 drop per eye daily as needed for red, itchy, watery eyes   Food allergy/intolerance:  - continue avoidance of alcohol  In case of SKIN  (hives) + ANY additional symptoms (vomiting, diarrhea, nausea, trouble breathing, coughing), OR IF concern for LIFE THREATENING reaction = Epipen Autoinjector EpiPen 0.3 mg. - If using Epinephrine autoinjector, call 911  GERD:  - diet and lifesyle modifications as below - Continue  pantoprazole 20mg  daily   Recurrent Infections  -Keep track of any and all infections including abscesses and sinopulmonary infections  Follow up: 3-4 months    Thank you so much for letting me partake in your care today.  Don't hesitate to reach out if you have any additional concerns!  Ferol Luz, MD  [facility] [place], [place]

## 2024-01-09 MED ORDER — PREDNISONE 10 MG PO TABS: ORAL_TABLET | ORAL | 0 refills | Status: DC

## 2024-04-08 ENCOUNTER — Ambulatory Visit (INDEPENDENT_AMBULATORY_CARE_PROVIDER_SITE_OTHER): Payer: Self-pay | Admitting: Family Medicine

## 2024-04-08 ENCOUNTER — Encounter: Payer: Self-pay | Admitting: Family Medicine

## 2024-04-08 VITALS — BP 92/60 | HR 82 | Ht 71.5 in | Wt 208.0 lb

## 2024-04-08 DIAGNOSIS — G8929 Other chronic pain: Secondary | ICD-10-CM

## 2024-04-08 DIAGNOSIS — K611 Rectal abscess: Secondary | ICD-10-CM

## 2024-04-08 DIAGNOSIS — M545 Low back pain, unspecified: Secondary | ICD-10-CM

## 2024-04-08 MED ORDER — DICLOFENAC SODIUM 75 MG PO TBEC: 75.0000 mg | DELAYED_RELEASE_TABLET | Freq: Two times a day (BID) | ORAL | 0 refills | Status: DC

## 2024-04-08 MED ORDER — CYCLOBENZAPRINE HCL 5 MG PO TABS: 5.0000 mg | ORAL_TABLET | Freq: Three times a day (TID) | ORAL | 0 refills | Status: DC | PRN

## 2024-04-08 MED ORDER — DOXYCYCLINE HYCLATE 100 MG PO TABS: 100.0000 mg | ORAL_TABLET | Freq: Two times a day (BID) | ORAL | 0 refills | Status: AC

## 2024-04-08 MED ORDER — PREDNISONE 50 MG PO TABS: 50.0000 mg | ORAL_TABLET | Freq: Every day | ORAL | 0 refills | Status: DC

## 2024-04-08 NOTE — Progress Notes (Signed)
 Primary Care / Sports Medicine Office Visit  Patient Information:  Patient ID: Randall Shelton, male DOB: [birth year] Age: 43 y.o. MRN: [id number]   Randall Shelton is a pleasant 43 y.o. male presenting with the following:  Chief Complaint  Patient presents with   Back Pain    Low back pain x 2-3 nights. Pain starts after patient lays down for be. Once he gets up and moving the pain improves. He took tylenol  but it did not seem to help. The left side hurts wore than the right. No radiating pain. No weakness, numbness or tingling. No change in bowel or bladder functions or habits.    Cyst    Cyst on left left buttock region x 1 week. Cyst started to drain 4 days ago. Patient is wondering if he need ABX.    Vitals:   04/08/24 1331  BP: 92/60  Pulse: 82  SpO2: 93%   Vitals:   04/08/24 1331  Weight: 208 lb (94.3 kg)  Height: 5' 11.5 (1.816 m)   Body mass index is 28.61 kg/m.  No results found.   Independent interpretation of notes and tests performed by another provider:   None  Procedures performed:   None  Pertinent History, Exam, Impression, and Recommendations:   Problem List Items Addressed This Visit     Chronic back pain - Primary   Low back pain - Chronic low back pain since at least 2017, with prior evaluation in 2014 - Current flare-up over the past few days - Constant dull ache, intensifies at night, dulls during the day with movement - Occasional sharp pain with certain movements, more often on the right side - Prolonged sitting exacerbates pain and leads to numbness - No radiation of pain down the legs - Numbness occurs only with prolonged sitting - Recent change to a new pair of [employer] shoes - No other changes in activity or routine  Physical Exam SPECIAL TESTS: Negative straight leg raise bilaterally. Negative piriformis and FADIR testing bilaterally. Positive Faber testing on the left. Kemp's test localizes to the right bilaterally.  Resisted iliopsoas  testing on the right is benign. Negative piriformis testing bilaterally.  Hamstring tightness greater right more so than left.  Low back pain Acute flare-up of chronic low back pain likely due to footwear change affecting body mechanics in the setting of anticipated underlying degenerative changes. No radicular symptoms or significant neurological deficits. Hamstring tightness and right-sided lumbar stiffness suggest facet-related pain. - Prescribed diclofenac  75 mg twice daily with food for at least one week, then as needed. - Advised against using other NSAIDs; acetaminophen  permissible. - Prescribed cyclobenzaprine  as needed, max two tablets three times a day. - Prescribed prednisone  as backup if diclofenac  ineffective after one week. - Provided low back exercises through My Chart to begin if symptoms improve by early to mid next week. - Advised maintaining moderate physical activity to prevent stiffness. - Instructed to contact via My Chart if symptoms do not improve or worsen.      Relevant Medications   diclofenac  (VOLTAREN ) 75 MG EC tablet   cyclobenzaprine  (FLEXERIL ) 5 MG tablet   predniSONE  (DELTASONE ) 50 MG tablet   Perirectal abscess   Left buttock abscess - Recurrent abscess on the left buttock, perirectal area, present for about one week - History of similar abscesses - Abscess has started draining pinkish-clear fluid - Use of hot compresses and sitz baths for management - Recent fevers and chills - No constipation  Physical Exam Anogenital: Inspection reveals roughly 2 x 2 left perirectal raised, indurated tender area with scant clear to yellow discharge.  No overt purulence  Abscess of left buttock Recurrent abscess with recent drainage. No hemorrhoid or significant complications, but recent fevers and chills suggest possible infection. - Prescribed doxycycline  for infection management.  Take 1 tablet twice daily x 7 days. - Continue sitz baths for local care. -  Advised gentle hygiene practices to avoid irritation.        Orders & Medications Medications:  Meds ordered this encounter  Medications   diclofenac  (VOLTAREN ) 75 MG EC tablet    Sig: Take 1 tablet (75 mg total) by mouth 2 (two) times daily.    Dispense:  60 tablet    Refill:  0   cyclobenzaprine  (FLEXERIL ) 5 MG tablet    Sig: Take 1-2 tablets (5-10 mg total) by mouth 3 (three) times daily as needed for muscle spasms.    Dispense:  90 tablet    Refill:  0   predniSONE  (DELTASONE ) 50 MG tablet    Sig: Take 1 tablet (50 mg total) by mouth daily.    Dispense:  5 tablet    Refill:  0   doxycycline  (VIBRA -TABS) 100 MG tablet    Sig: Take 1 tablet (100 mg total) by mouth 2 (two) times daily for 7 days.    Dispense:  14 tablet    Refill:  0   No orders of the defined types were placed in this encounter.    No follow-ups on file.     Selinda JINNY Ku, MD, [facility]   Primary Care Sports Medicine Primary Care and Sports Medicine at [facility]

## 2024-04-08 NOTE — Assessment & Plan Note (Signed)
 Low back pain - Chronic low back pain since at least 2017, with prior evaluation in 2014 - Current flare-up over the past few days - Constant dull ache, intensifies at night, dulls during the day with movement - Occasional sharp pain with certain movements, more often on the right side - Prolonged sitting exacerbates pain and leads to numbness - No radiation of pain down the legs - Numbness occurs only with prolonged sitting - Recent change to a new pair of [employer]oka shoes - No other changes in activity or routine  Physical Exam SPECIAL TESTS: Negative straight leg raise bilaterally. Negative piriformis and FADIR testing bilaterally. Positive Faber testing on the left. Kemp's test localizes to the right bilaterally.  Resisted iliopsoas testing on the right is benign. Negative piriformis testing bilaterally.  Hamstring tightness greater right more so than left.  Low back pain Acute flare-up of chronic low back pain likely due to footwear change affecting body mechanics in the setting of anticipated underlying degenerative changes. No radicular symptoms or significant neurological deficits. Hamstring tightness and right-sided lumbar stiffness suggest facet-related pain. - Prescribed diclofenac  75 mg twice daily with food for at least one week, then as needed. - Advised against using other NSAIDs; acetaminophen  permissible. - Prescribed cyclobenzaprine  as needed, max two tablets three times a day. - Prescribed prednisone  as backup if diclofenac  ineffective after one week. - Provided low back exercises through My Chart to begin if symptoms improve by early to mid next week. - Advised maintaining moderate physical activity to prevent stiffness. - Instructed to contact via My Chart if symptoms do not improve or worsen.

## 2024-04-08 NOTE — Assessment & Plan Note (Signed)
 Left buttock abscess - Recurrent abscess on the left buttock, perirectal area, present for about one week - History of similar abscesses - Abscess has started draining pinkish-clear fluid - Use of hot compresses and sitz baths for management - Recent fevers and chills - No constipation  Physical Exam Anogenital: Inspection reveals roughly 2 x 2 left perirectal raised, indurated tender area with scant clear to yellow discharge.  No overt purulence  Abscess of left buttock Recurrent abscess with recent drainage. No hemorrhoid or significant complications, but recent fevers and chills suggest possible infection. - Prescribed doxycycline  for infection management.  Take 1 tablet twice daily x 7 days. - Continue sitz baths for local care. - Advised gentle hygiene practices to avoid irritation.

## 2024-04-08 NOTE — Patient Instructions (Signed)
 Patient Action Plan  1. Chronic Low Back Pain:    - Take diclofenac  75 mg twice daily with food for at least one week, then as needed. Avoid other NSAIDs; acetaminophen  is allowed.    - Use cyclobenzaprine  as needed, up to two tablets three times a day.    - If diclofenac  is ineffective after one week, start prednisone  as prescribed.    - Begin low back exercises provided through My Chart if symptoms improve by early to mid next week.    - Maintain moderate physical activity to prevent stiffness.    - Contact via My Chart if symptoms do not improve or worsen.  2. Left Buttock Abscess:    - Take doxycycline  as prescribed, one tablet twice daily for 7 days.    - Continue using sitz baths for local care.    - Practice gentle hygiene to avoid irritation.  Red Flags: - If you experience worsening symptoms, new symptoms, or no improvement, contact the clinic through My Chart.

## 2024-09-13 ENCOUNTER — Encounter: Payer: Self-pay | Admitting: Family Medicine

## 2024-09-13 ENCOUNTER — Ambulatory Visit (INDEPENDENT_AMBULATORY_CARE_PROVIDER_SITE_OTHER): Admitting: Family Medicine

## 2024-09-13 VITALS — BP 90/58 | HR 72 | Temp 97.8°F | Ht 71.5 in | Wt 196.0 lb

## 2024-09-13 DIAGNOSIS — J069 Acute upper respiratory infection, unspecified: Secondary | ICD-10-CM | POA: Insufficient documentation

## 2024-09-13 DIAGNOSIS — J4489 Other specified chronic obstructive pulmonary disease: Secondary | ICD-10-CM | POA: Diagnosis not present

## 2024-09-13 MED ORDER — IPRATROPIUM-ALBUTEROL 0.5-2.5 (3) MG/3ML IN SOLN: 3.0000 mL | Freq: Four times a day (QID) | RESPIRATORY_TRACT | 3 refills | Status: AC | PRN

## 2024-09-13 MED ORDER — METHYLPREDNISOLONE 4 MG PO TBPK: ORAL_TABLET | ORAL | 0 refills | Status: DC

## 2024-09-13 MED ORDER — BUDESONIDE-FORMOTEROL FUMARATE 80-4.5 MCG/ACT IN AERO: 2.0000 | INHALATION_SPRAY | Freq: Two times a day (BID) | RESPIRATORY_TRACT | 5 refills | Status: DC

## 2024-09-13 MED ORDER — IPRATROPIUM BROMIDE 0.03 % NA SOLN: 2.0000 | Freq: Two times a day (BID) | NASAL | 0 refills | Status: DC

## 2024-09-13 NOTE — Patient Instructions (Signed)
 VISIT SUMMARY:  You visited us  today due to respiratory symptoms and a possible asthma exacerbation. Your symptoms started with a sore throat and body aches, followed by a persistent feverish sensation, cough, wheezing, and chest tightness. You have also been experiencing significant fatigue and live in a low humidity environment, which may be contributing to your symptoms.  YOUR PLAN:  ASTHMA EXACERBATION: Your asthma symptoms have worsened, likely due to an upper respiratory infection. -You received a DuoNeb nebulizer treatment with albuterol  and ipratropium in the office. -You have been prescribed a nebulizer machine and solution for home use. -You have been prescribed an inhaler to use in addition to your Airsupra  inhaler. -You have been prescribed prednisone  to help control inflammation. Be aware of potential side effects like increased appetite and jitteriness. -We provided you with samples of the Airsupra  inhaler.  ACUTE UPPER RESPIRATORY INFECTION: Your symptoms overlap with your asthma exacerbation, making it difficult to distinguish between the two. -Monitor your response to the asthma treatment to see if your symptoms improve. -If your symptoms do not improve with asthma treatment, we may consider additional treatment for your upper respiratory symptoms.

## 2024-09-13 NOTE — Progress Notes (Signed)
 Primary Care / Sports Medicine Office Visit  Patient Information:  Patient ID: Randall Shelton, male DOB: 07/23/1981 Age: 43 y.o. MRN: [id number]   Randall Shelton is a pleasant 43 y.o. male presenting with the following:  Chief Complaint  Patient presents with   Cough    Cough, congestion, and fever since 09/08/24. Patient has taken cold and flu medication to relieve symptoms and using Vicks steamers to loosen mucus. He continues to feel bad and would like to discuss different treatment option.     Vitals:   09/13/24 1311  BP: (!) 90/58  Pulse: 72  Temp: 97.8 F (36.6 C)  SpO2: 96%   Vitals:   09/13/24 1311  Weight: 196 lb (88.9 kg)  Height: 5' 11.5 (1.816 m)   Body mass index is 26.96 kg/m.  No results found.   Discussed the use of AI scribe software for clinical note transcription with the patient, who gave verbal consent to proceed.   Independent interpretation of notes and tests performed by another provider:   None  Procedures performed:   None  Pertinent History, Exam, Impression, and Recommendations:   History of Present Illness Randall Shelton is a 43 year old male with asthma who presents with respiratory symptoms and possible asthma exacerbation.  Respiratory symptoms and asthma exacerbation - Onset December 10th with scratchy, sore throat and body aches - Low-grade fever with chills and sweats, no high-grade fever - Sore throat resolved by Friday, but persistent feverish sensation - Development of cough over the weekend - Wheezing and chest tightness - Use of Vicks steam inhaler to loosen mucus - Use of Airsupra  inhaler obtained from allergist - Stopped using Symbicort  after loss of insurance - Symptoms worsened in cold air, prompting absence from work today  Fatigue and malaise - Significant fatigue and low energy - Attributed to body fighting illness  Environmental factors - Lives in low humidity environment with gas heat - Room humidifier  raises humidity to only 25%  Recent sinus infection - Sinus infection last month, described as different from current symptoms - Sinus infection felt 'up top,' current symptoms localized to chest  Physical Exam VITALS: SaO2- 96% HEENT: Oropharynx erythematous, no purulence. TMs benign, canals normal. Nasal mucosa erythematous bilaterally. No sinus tenderness. NECK: Neck supple, non-tender. CHEST: Decreased air entry, wheezes, coarse breath sounds.  Assessment and Plan Asthma exacerbation Likely triggered by upper respiratory infection. Symptoms and examination suggest asthma exacerbation over pneumonia. - Administered DuoNeb nebulizer treatment with albuterol  and ipratropium. - Prescribed nebulizer machine and solution for home use. - Prescribed inhaler for use in addition to Airsupra . - Prescribed prednisone  for inflammation control. - Provided samples of Airsupra  inhaler. - Discussed potential side effects of prednisone , including increased appetite and jitteriness.  Acute upper respiratory infection Symptoms overlap with asthma exacerbation, complicating differentiation. - Monitor response to asthma treatment to assess symptom improvement. - Consider additional treatment for upper respiratory symptoms if asthma treatment does not alleviate symptoms.  Problem List Items Addressed This Visit     Asthma-COPD overlap syndrome (HCC) - Primary   Relevant Medications   ipratropium-albuterol  (DUONEB) 0.5-2.5 (3) MG/3ML SOLN   ipratropium (ATROVENT ) 0.03 % nasal spray   methylPREDNISolone  (MEDROL  DOSEPAK) 4 MG TBPK tablet   budesonide -formoterol  (SYMBICORT ) 80-4.5 MCG/ACT inhaler   Viral upper respiratory tract infection     Orders & Medications Medications:  Meds ordered this encounter  Medications   ipratropium-albuterol  (DUONEB) 0.5-2.5 (3) MG/3ML SOLN    Sig:  Take 3 mLs by nebulization every 6 (six) hours as needed.    Dispense:  120 mL    Refill:  3    Please dispense  #120 vials   ipratropium (ATROVENT ) 0.03 % nasal spray    Sig: Place 2 sprays into both nostrils every 12 (twelve) hours.    Dispense:  30 mL    Refill:  0   methylPREDNISolone  (MEDROL  DOSEPAK) 4 MG TBPK tablet    Sig: Use as directed.    Dispense:  21 each    Refill:  0   budesonide -formoterol  (SYMBICORT ) 80-4.5 MCG/ACT inhaler    Sig: Inhale 2 puffs into the lungs in the morning and at bedtime.    Dispense:  1 each    Refill:  5   No orders of the defined types were placed in this encounter.    No follow-ups on file.     Selinda JINNY Ku, MD, [facility]   Primary Care Sports Medicine Primary Care and Sports Medicine at [facility]

## 2024-09-15 ENCOUNTER — Telehealth: Payer: Self-pay | Admitting: Family Medicine

## 2024-09-20 ENCOUNTER — Encounter: Payer: Self-pay | Admitting: Family Medicine

## 2024-09-21 ENCOUNTER — Encounter: Payer: Self-pay | Admitting: Family Medicine

## 2024-09-21 ENCOUNTER — Ambulatory Visit (INDEPENDENT_AMBULATORY_CARE_PROVIDER_SITE_OTHER): Admitting: Family Medicine

## 2024-09-21 ENCOUNTER — Ambulatory Visit: Payer: Self-pay

## 2024-09-21 VITALS — BP 108/64 | HR 87 | Ht 71.5 in | Wt 197.0 lb

## 2024-09-21 DIAGNOSIS — K610 Anal abscess: Secondary | ICD-10-CM | POA: Diagnosis not present

## 2024-09-21 MED ORDER — AMOXICILLIN-POT CLAVULANATE 875-125 MG PO TABS: 1.0000 | ORAL_TABLET | Freq: Two times a day (BID) | ORAL | 0 refills | Status: AC

## 2024-09-21 MED ORDER — TRAMADOL HCL 50 MG PO TABS: 50.0000 mg | ORAL_TABLET | Freq: Three times a day (TID) | ORAL | 0 refills | Status: AC | PRN

## 2024-09-21 NOTE — Telephone Encounter (Signed)
 FYI Only or Action Required?: FYI only for provider: appointment scheduled on 09/21/2024.  Patient was last seen in primary care on 09/13/2024 by Alvia Selinda PARAS, MD.  Called Nurse Triage reporting Rectal Pain.  Symptoms began yesterday.  Interventions attempted: Rest, hydration, or home remedies.  Symptoms are: gradually worsening.  Triage Disposition: See HCP Within 4 Hours (Or PCP Triage)  Patient/caregiver understands and will follow disposition?: Yes  Reason for Disposition  [1] Rectal pain or redness AND [2] fever  Answer Assessment - Initial Assessment Questions 1. SYMPTOM:  What's the main symptom you're concerned about? (e.g., pain, itching, swelling, rash)     Rectal pain and swelling-states the area of swelling feels firm  2. ONSET: When did the symptoms  start?     Yesterday  3. RECTAL PAIN: Do you have any pain around your rectum? How bad is the pain?  (Scale 0-10; or none, mild, moderate, severe)     Yes, 7/10  4. RECTAL ITCHING: Do you have any itching in this area? How bad is the itching?  (Scale 0-10; or none, mild, moderate, severe)     Yes  5. CONSTIPATION: Do you have constipation? If Yes, ask: How often do you have a bowel movement (BM)?  (Normal range: 3 times a day to every 3 days)  When was your last BM?       Yes, the last 2 days  6. CAUSE: What do you think is causing the anus symptoms?     Abscess-has had one in the past  7. OTHER SYMPTOMS: Do you have any other symptoms?  (e.g., abdomen pain, fever, rectal bleeding, vomiting)     Believes he had fever last night, lower abdominal pain, small amount of purulent blood this morning on toilet paper  8. PREGNANCY: Is there any chance you are pregnant? When was your last menstrual period?     NA  Protocols used: Rectal Symptoms-A-AH  Copied from CRM [id number]. Topic: Clinical - Red Word Triage >> Sep 21, 2024 12:11 PM Willma R wrote: [facility] that prompted transfer to  Nurse Triage: Patient states he believes he has a perirectal abscess. Has a lot of pain and swelling.

## 2024-09-21 NOTE — Telephone Encounter (Signed)
 Noted  Pt has appt.  KP

## 2024-09-22 DIAGNOSIS — K610 Anal abscess: Secondary | ICD-10-CM | POA: Insufficient documentation

## 2024-09-22 HISTORY — DX: Anal abscess: K61.0

## 2024-09-22 NOTE — Assessment & Plan Note (Signed)
 History of Present Illness Randall Shelton is a 43 year old male with prior perianal abscess who presents with acute painful perirectal swelling.  Perirectal pain and swelling - Acute onset of severe, constant pain and swelling adjacent to the rectum, primarily unilateral, beginning yesterday morning - Swelling progressively worsened throughout the day - Pain described as burning in quality - No pruritus - Currently off work for two days due to symptoms, anticipates returning Friday  Drainage and rectal bleeding - Minimal drainage today, described as blood without purulence - Reddish discoloration of toilet water during last bowel movement, consistent with minor rectal bleeding  Prior episodes and lesions - History of similar episodes in the past, previously requiring intervention - Currently has a smaller lesion on the contralateral buttock, but most significant symptoms are localized to one side  Symptom management - Performed sitz baths for symptomatic relief - Initiated hydrocodone  every six hours yesterday with minimal relief - No use of acetaminophen  or NSAIDs  Physical Exam RECTAL: Rectal area shows induration, erythema, tenderness, and signs of 1x2 cm abscess at 7 o'clock , not suggestive of thrombosis.  Assessment and Plan Perianal abscess Acute moderate perianal abscess with pain, swelling, erythema, induration, and minimal drainage. Prior episodes required intervention. Current management focuses on infection control, analgesia, and hygiene. Surgical referral for non-resolution or recurrence. Oral antibiotics warranted. Incision and drainage with wound packing if conservative measures fail. Risks include recurrence, infection, and delayed healing; resolution anticipated. - Prescribed oral antibiotics. - Recommended acetaminophen  and ibuprofen  up to three times daily for pain. - Prescribed tramadol  for breakthrough pain, five-day supply. - Advised soft, easily digestible  diet. - Instructed on gentle hygiene: sitz baths, shower rinsing, blotting dry. - Referred to general surgery for possible incision and drainage with wound packing if abscess does not resolve or recurs. - Advised to avoid topical agents; use oral diphenhydramine if pruritus develops. - Provided work excuse note until Saturday, December 27th. - Instructed to monitor for worsening symptoms and attend surgical appointment if abscess does not fully resolve.

## 2024-09-22 NOTE — Progress Notes (Signed)
 "    Primary Care / Sports Medicine Office Visit  Patient Information:  Patient ID: Randall Shelton, male DOB: [birth year] Age: 43 y.o. MRN: [id number]   Randall Shelton is a pleasant 43 y.o. male presenting with the following:  Chief Complaint  Patient presents with   Rectal Pain    Rectal pain and swelling since yesterday. Patient has history of Perirectal abscess. He believes this is what is going on now. Uncomfortable to sit.    Vitals:   09/21/24 1629  BP: 108/64  Pulse: 87  SpO2: 98%   Vitals:   09/21/24 1629  Weight: 197 lb (89.4 kg)  Height: 5' 11.5 (1.816 m)   Body mass index is 27.09 kg/m.  No results found.   Discussed the use of AI scribe software for clinical note transcription with the patient, who gave verbal consent to proceed.   Independent interpretation of notes and tests performed by another provider:   None  Procedures performed:   None  Pertinent History, Exam, Impression, and Recommendations:   Problem List Items Addressed This Visit     Perianal abscess - Primary   History of Present Illness Randall Shelton is a 43 year old male with prior perianal abscess who presents with acute painful perirectal swelling.  Perirectal pain and swelling - Acute onset of severe, constant pain and swelling adjacent to the rectum, primarily unilateral, beginning yesterday morning - Swelling progressively worsened throughout the day - Pain described as burning in quality - No pruritus - Currently off work for two days due to symptoms, anticipates returning Friday  Drainage and rectal bleeding - Minimal drainage today, described as blood without purulence - Reddish discoloration of toilet water during last bowel movement, consistent with minor rectal bleeding  Prior episodes and lesions - History of similar episodes in the past, previously requiring intervention - Currently has a smaller lesion on the contralateral buttock, but most significant symptoms are  localized to one side  Symptom management - Performed sitz baths for symptomatic relief - Initiated hydrocodone  every six hours yesterday with minimal relief - No use of acetaminophen  or NSAIDs  Physical Exam RECTAL: Rectal area shows induration, erythema, tenderness, and signs of 1x2 cm abscess at 7 o'clock , not suggestive of thrombosis.  Assessment and Plan Perianal abscess Acute moderate perianal abscess with pain, swelling, erythema, induration, and minimal drainage. Prior episodes required intervention. Current management focuses on infection control, analgesia, and hygiene. Surgical referral for non-resolution or recurrence. Oral antibiotics warranted. Incision and drainage with wound packing if conservative measures fail. Risks include recurrence, infection, and delayed healing; resolution anticipated. - Prescribed oral antibiotics. - Recommended acetaminophen  and ibuprofen  up to three times daily for pain. - Prescribed tramadol  for breakthrough pain, five-day supply. - Advised soft, easily digestible diet. - Instructed on gentle hygiene: sitz baths, shower rinsing, blotting dry. - Referred to general surgery for possible incision and drainage with wound packing if abscess does not resolve or recurs. - Advised to avoid topical agents; use oral diphenhydramine if pruritus develops. - Provided work excuse note until Saturday, December 27th. - Instructed to monitor for worsening symptoms and attend surgical appointment if abscess does not fully resolve.      Relevant Medications   amoxicillin -clavulanate (AUGMENTIN ) 875-125 MG tablet   traMADol  (ULTRAM ) 50 MG tablet   Other Relevant Orders   Ambulatory referral to General Surgery     Orders & Medications Medications:  Meds ordered this encounter  Medications   amoxicillin -clavulanate (AUGMENTIN )  875-125 MG tablet    Sig: Take 1 tablet by mouth 2 (two) times daily for 7 days.    Dispense:  14 tablet    Refill:  0    traMADol  (ULTRAM ) 50 MG tablet    Sig: Take 1 tablet (50 mg total) by mouth every 8 (eight) hours as needed for up to 5 days.    Dispense:  15 tablet    Refill:  0   Orders Placed This Encounter  Procedures   Ambulatory referral to General Surgery     No follow-ups on file.     Selinda JINNY Ku, MD, [facility]   Primary Care Sports Medicine Primary Care and Sports Medicine at [facility]   "

## 2024-09-22 NOTE — Patient Instructions (Signed)
 VISIT SUMMARY:  You came in today due to severe pain and swelling near your rectum, which started suddenly yesterday. You also noticed some minor bleeding during your last bowel movement. You have a history of similar issues that required treatment.  YOUR PLAN:  PERIANAL ABSCESS: You have an acute perianal abscess causing pain, swelling, and minimal drainage. This condition has occurred before and required treatment. -You have been prescribed oral antibiotics to control the infection. -Take acetaminophen  and ibuprofen  up to three times daily for pain relief. -You have been prescribed tramadol  for breakthrough pain, with a five-day supply. -Follow a soft, easily digestible diet. -Maintain gentle hygiene by taking sitz baths, rinsing in the shower, and blotting dry. -You have been referred to general surgery for possible incision and drainage with wound packing if the abscess does not resolve or recurs. -Avoid using topical agents; if you develop itching, you can take oral diphenhydramine. -You have been provided a work excuse note until Saturday, December 27th. -Monitor for worsening symptoms and attend your surgical appointment if the abscess does not fully resolve.

## 2024-10-06 ENCOUNTER — Encounter: Payer: Self-pay | Admitting: Family Medicine

## 2024-10-06 ENCOUNTER — Ambulatory Visit (INDEPENDENT_AMBULATORY_CARE_PROVIDER_SITE_OTHER): Admitting: Family Medicine

## 2024-10-06 VITALS — BP 84/50 | HR 68 | Temp 97.8°F | Ht 71.5 in | Wt 200.0 lb

## 2024-10-06 DIAGNOSIS — R194 Change in bowel habit: Secondary | ICD-10-CM

## 2024-10-06 DIAGNOSIS — Z720 Tobacco use: Secondary | ICD-10-CM

## 2024-10-06 DIAGNOSIS — N400 Enlarged prostate without lower urinary tract symptoms: Secondary | ICD-10-CM

## 2024-10-06 DIAGNOSIS — J4530 Mild persistent asthma, uncomplicated: Secondary | ICD-10-CM

## 2024-10-06 DIAGNOSIS — K219 Gastro-esophageal reflux disease without esophagitis: Secondary | ICD-10-CM | POA: Diagnosis not present

## 2024-10-06 DIAGNOSIS — Z0000 Encounter for general adult medical examination without abnormal findings: Secondary | ICD-10-CM

## 2024-10-06 DIAGNOSIS — G8929 Other chronic pain: Secondary | ICD-10-CM | POA: Diagnosis not present

## 2024-10-06 DIAGNOSIS — M545 Low back pain, unspecified: Secondary | ICD-10-CM | POA: Diagnosis not present

## 2024-10-06 DIAGNOSIS — K529 Noninfective gastroenteritis and colitis, unspecified: Secondary | ICD-10-CM

## 2024-10-06 DIAGNOSIS — Z716 Tobacco abuse counseling: Secondary | ICD-10-CM

## 2024-10-06 DIAGNOSIS — J4489 Other specified chronic obstructive pulmonary disease: Secondary | ICD-10-CM | POA: Diagnosis not present

## 2024-10-06 MED ORDER — BUDESONIDE-FORMOTEROL FUMARATE 160-4.5 MCG/ACT IN AERO: 2.0000 | INHALATION_SPRAY | Freq: Two times a day (BID) | RESPIRATORY_TRACT | 3 refills | Status: AC

## 2024-10-06 MED ORDER — LOPERAMIDE HCL 2 MG PO CAPS: 2.0000 mg | ORAL_CAPSULE | ORAL | 0 refills | Status: AC | PRN

## 2024-10-06 NOTE — Progress Notes (Signed)
 "    Annual Physical Exam Visit  Patient Information:  Patient ID: Randall Shelton, male DOB: 28-Mar-1981 Age: 44 y.o. MRN: [id number]   Subjective:   CC: Annual Physical Exam  HPI:  Randall Shelton is here for their annual physical.  I reviewed the past medical history, family history, social history, surgical history, and allergies today and changes were made as necessary.  Please see the problem list section below for additional details.  Past Medical History: Past Medical History:  Diagnosis Date   Asthma    Chronic back pain    Irregular heart beat    Pancreatitis    Perianal abscess 09/22/2024   Proctocolitis    Past Surgical History: Past Surgical History:  Procedure Laterality Date   BIOPSY  05/20/2023   Procedure: BIOPSY;  Surgeon: Jinny Carmine, MD;  Location: [facility];  Service: Endoscopy;;   COLONOSCOPY WITH PROPOFOL  N/A 05/20/2023   Procedure: COLONOSCOPY WITH PROPOFOL ;  Surgeon: Jinny Carmine, MD;  Location: [facility];  Service: Endoscopy;  Laterality: N/A;   INCISION AND DRAINAGE ABSCESS N/A 08/18/2020   Procedure: INCISION AND DRAINAGE ABSCESS, perirectal abscess;  Surgeon: Lane Shope, MD;  Location: [facility] ORS;  Service: General;  Laterality: N/A;   POLYPECTOMY  05/20/2023   Procedure: POLYPECTOMY;  Surgeon: Jinny Carmine, MD;  Location: [facility];  Service: Endoscopy;;   WISDOM TOOTH EXTRACTION Bilateral 2002   Family History: Family History  Problem Relation Age of Onset   COPD Mother        Smoker   Hiatal hernia Mother    Heart failure Father    Heart disease Father    Hypertension Father    Diverticulitis Father    Bone cancer Father    Seizures Sister    Heart disease Sister    Diabetes Mellitus II Maternal Grandmother    Pneumonia Maternal Grandmother    Cancer Maternal Grandfather    Heart attack Paternal Grandmother    Pneumonia Paternal Grandfather    High Cholesterol Son    Allergies: Allergies[1] Health  Maintenance: Health Maintenance  Topic Date Due   Influenza Vaccine  12/28/2024 (Originally 04/30/2024)   Pneumococcal Vaccine (1 of 2 - PCV) 10/06/2025 (Originally 02/19/2000)   Hepatitis B Vaccines 19-59 Average Risk (1 of 3 - 19+ 3-dose series) 10/06/2025 (Originally 02/19/2000)   HPV VACCINES (1 - Risk 3-dose SCDM series) 10/06/2025 (Originally 02/19/2008)   Colonoscopy  05/19/2030   DTaP/Tdap/Td (2 - Td or Tdap) 04/05/2031   Hepatitis C Screening  Completed   HIV Screening  Completed   Meningococcal B Vaccine  Aged Out   COVID-19 Vaccine  Discontinued    HM Colonoscopy          Upcoming     Colonoscopy (Every 7 Years) Next due on 05/19/2030    05/20/2023  COLONOSCOPY  Only the first 1 history entries have been loaded, but more history exists.              Medications: Medications Ordered Prior to Encounter[2]  Discussed the use of AI scribe software for clinical note transcription with the patient, who gave verbal consent to proceed.   Objective:   Vitals:   10/06/24 0801  BP: (!) 84/50  Pulse: 68  Temp: 97.8 F (36.6 C)  SpO2: 95%   Vitals:   10/06/24 0801  Weight: 200 lb (90.7 kg)  Height: 5' 11.5 (1.816 m)   Body mass index is 27.51 kg/m.  General: Well Developed, well nourished, and in  no acute distress.  Neuro: Alert and oriented x3, extra-ocular muscles intact, sensation grossly intact. Cranial nerves II through XII are grossly intact, motor, sensory, and coordinative functions are intact. HEENT: Normocephalic, atraumatic, neck supple, no masses, no lymphadenopathy, thyroid  nonenlarged. Oropharynx, nasopharynx, external ear canals are unremarkable. Skin: Warm and dry, no rashes noted.  Cardiac: Regular rate and rhythm, no murmurs rubs or gallops. No peripheral edema. Pulses symmetric. Respiratory: Bibasilar faint wheeze, otherwise clear to auscultation bilaterally. Speaking in full sentences.  Abdominal: Soft, nontender, nondistended, positive bowel  sounds, no masses, no organomegaly. Musculoskeletal: Stable, and with full range of motion.  Impression and Recommendations:   History of Present Illness Randall Shelton is a 44 year old male with recurrent perirectal abscess who presents for follow-up of a recently ruptured and draining perirectal abscess.  Perirectal abscess and drainage - Recently ruptured and draining perirectal abscess, with initial rupture and significant drainage occurring 1-2 days after onset prior to last visit on September 21, 2024 - Continues to experience intermittent minor drainage, most recently during sitz baths - No associated pain since rupture - Completed a course of antibiotics without complications - Continues regular sitz baths - No current surgical follow-up scheduled - History of prior surgical intervention for abscess on the contralateral side, resulting in a large scar  Constitutional and systemic symptoms - No fever, chills, sweats, back pain, or upper respiratory symptoms  Results Buccal mucosa swab for alpha-1 antitrypsin deficiency genetic testing  Assessment and Plan Irritable bowel syndrome with diarrhea Chronic IBS-D with significant impact on quality of life. Pancreatic enzyme deficiency history but reports subsequent possible rule-out. - Prescribed loperamide  2 mg before meals. - Advised OTC loperamide  if prescription not covered. - Provided low FODMAP diet information via MyChart. - Referred to gastroenterology for further evaluation and enzyme testing review. - Instructed to monitor response to loperamide  and diet for one week. - Advised to report inadequate symptom improvement for additional therapies.  Asthma-COPD overlap syndrome Chronic respiratory symptoms with seasonal exacerbation. Symptoms partially controlled; alpha-1 antitrypsin deficiency considered. - Prescribed higher strength Symbicort , same dosing. - Confirmed adequate Airsupra  supply. - Performed cheek swab for  alpha-1 antitrypsin deficiency testing.  Perirectal abscess, healing Healing well post spontaneous drainage; no pain, minor drainage. Completed antibiotics. - Advised continuation of sitz baths. - Discussed establishing care with general surgery for future episodes. - Provided guidance to contact provider or surgeon for prompt management if recurrence.  Nicotine dependence, cigarettes Chronic tobacco use with previous adverse reaction to bupropion . Not ready for cessation but open to future options. - Discussed alternative cessation options, including varenicline, and potential side effects. - Advised to research varenicline for future cessation attempts. - Reassured multiple cessation options available and encouraged to reach out when ready.  The patient was counselled, risk factors were discussed, and anticipatory guidance given.  Problem List Items Addressed This Visit     Asthma-COPD overlap syndrome (HCC)   Relevant Medications   budesonide -formoterol  (SYMBICORT ) 160-4.5 MCG/ACT inhaler   Change in bowel habits   Chronic back pain   Gastroesophageal reflux disease   Relevant Medications   loperamide  (IMODIUM ) 2 MG capsule   Healthcare maintenance - Primary   Relevant Orders   CBC   Comprehensive metabolic panel with GFR   Hemoglobin A1c   Lipid panel   PSA Total (Reflex To Free)   Mild persistent asthma without complication   Relevant Medications   budesonide -formoterol  (SYMBICORT ) 160-4.5 MCG/ACT inhaler   Tobacco abuse   Tobacco abuse  counseling   Other Visit Diagnoses       Benign prostatic hyperplasia, unspecified whether lower urinary tract symptoms present       Relevant Orders   PSA Total (Reflex To Free)     Chronic diarrhea       Relevant Orders   Ambulatory referral to Gastroenterology        Orders & Medications Medications:  Meds ordered this encounter  Medications   budesonide -formoterol  (SYMBICORT ) 160-4.5 MCG/ACT inhaler    Sig: Inhale 2  puffs into the lungs 2 (two) times daily.    Dispense:  1 each    Refill:  3   loperamide  (IMODIUM ) 2 MG capsule    Sig: Take 1 capsule (2 mg total) by mouth as needed for diarrhea or loose stools.    Dispense:  30 capsule    Refill:  0   Orders Placed This Encounter  Procedures   CBC   Comprehensive metabolic panel with GFR   Hemoglobin A1c   Lipid panel   PSA Total (Reflex To Free)   Ambulatory referral to Gastroenterology     Return in about 1 year (around 10/06/2025) for CPE.    Selinda JINNY Ku, MD, CAQSM   Primary Care Sports Medicine [facility]      [1] No Known Allergies [2]  Current Outpatient Medications on File Prior to Visit  Medication Sig Dispense Refill   Albuterol -Budesonide  (AIRSUPRA ) 90-80 MCG/ACT AERO Inhale 2 Inhalations into the lungs as needed. Maximum 12 puffs/day. 10.7 g 3   cyclobenzaprine  (FLEXERIL ) 5 MG tablet Take 1-2 tablets (5-10 mg total) by mouth 3 (three) times daily as needed for muscle spasms. 90 tablet 0   ibuprofen  (ADVIL ) 600 MG tablet Take 1 tablet (600 mg total) by mouth every 6 (six) hours as needed. 30 tablet 0   ipratropium (ATROVENT ) 0.03 % nasal spray Place 2 sprays into both nostrils every 12 (twelve) hours. 30 mL 0   ipratropium-albuterol  (DUONEB) 0.5-2.5 (3) MG/3ML SOLN Take 3 mLs by nebulization every 6 (six) hours as needed. 120 mL 3   methylPREDNISolone  (MEDROL  DOSEPAK) 4 MG TBPK tablet Use as directed. 21 each 0   No current facility-administered medications on file prior to visit.   "

## 2024-10-06 NOTE — Patient Instructions (Addendum)
-   Obtain fasting labs with orders provided (can have water or black coffee but otherwise no food or drink x 8 hours before labs) - Review information provided - Attend eye doctor annually, dentist every 6 months, work towards or maintain 30 minutes of moderate intensity physical activity at least 5 days per week, and consume a balanced diet - Return in 1 year for physical - Contact us  for any questions between now and then   VISIT SUMMARY:  During your visit, we discussed the follow-up of your recently ruptured perirectal abscess, management of irritable bowel syndrome with diarrhea (IBS-D), asthma-COPD overlap syndrome, and nicotine dependence.  YOUR PLAN:  PERIRECTAL ABSCESS, HEALING: Your perirectal abscess is healing well after spontaneous drainage. You have no pain and only minor drainage. -Continue with regular sitz baths. -Consider establishing care with general surgery for future episodes. -Contact your provider or surgeon promptly if there is a recurrence.  IRRITABLE BOWEL SYNDROME WITH DIARRHEA (IBS-D): You have chronic IBS-D that significantly impacts your quality of life. Pancreatic enzyme deficiency and inflammatory bowel disease have been ruled out. -Take loperamide  2 mg before meals. -Use over-the-counter loperamide  if the prescription is not covered. -Follow the low FODMAP diet information provided via MyChart. -Monitor your response to loperamide  and the diet for one week. -Report any inadequate symptom improvement for additional therapies. -You have been referred to gastroenterology for further evaluation and enzyme testing review.  ASTHMA-COPD OVERLAP SYNDROME: You have chronic respiratory symptoms with seasonal exacerbations. Your symptoms are partially controlled. -Use the higher strength Symbicort  as prescribed, with the same dosing. -Ensure you have an adequate supply of Airsupra . -A cheek swab was performed for alpha-1 antitrypsin deficiency testing.  NICOTINE  DEPENDENCE, CIGARETTES: You have chronic tobacco use and had a previous adverse reaction to bupropion . You are not ready for cessation but are open to future options. -Consider alternative cessation options, including varenicline, and be aware of potential side effects. -Research varenicline for future cessation attempts. -Remember that multiple cessation options are available and reach out when you are ready.

## 2024-10-07 ENCOUNTER — Ambulatory Visit: Payer: Self-pay | Admitting: Family Medicine

## 2024-10-07 LAB — COMPREHENSIVE METABOLIC PANEL WITH GFR
ALT: 12 IU/L (ref 0–44)
AST: 13 IU/L (ref 0–40)
Albumin: 4.3 g/dL (ref 4.1–5.1)
Alkaline Phosphatase: 76 IU/L (ref 47–123)
BUN/Creatinine Ratio: 13 (ref 9–20)
BUN: 13 mg/dL (ref 6–24)
Bilirubin Total: 0.4 mg/dL (ref 0.0–1.2)
CO2: 23 mmol/L (ref 20–29)
Calcium: 9.2 mg/dL (ref 8.7–10.2)
Chloride: 107 mmol/L — ABNORMAL HIGH (ref 96–106)
Creatinine, Ser: 1.02 mg/dL (ref 0.76–1.27)
Globulin, Total: 2.5 g/dL (ref 1.5–4.5)
Glucose: 89 mg/dL (ref 70–99)
Potassium: 4.3 mmol/L (ref 3.5–5.2)
Sodium: 141 mmol/L (ref 134–144)
Total Protein: 6.8 g/dL (ref 6.0–8.5)
eGFR: 94 mL/min/1.73

## 2024-10-07 LAB — CBC
Hematocrit: 41.9 % (ref 37.5–51.0)
Hemoglobin: 14 g/dL (ref 13.0–17.7)
MCH: 29.6 pg (ref 26.6–33.0)
MCHC: 33.4 g/dL (ref 31.5–35.7)
MCV: 89 fL (ref 79–97)
Platelets: 268 x10E3/uL (ref 150–450)
RBC: 4.73 x10E6/uL (ref 4.14–5.80)
RDW: 13.2 % (ref 11.6–15.4)
WBC: 9.8 x10E3/uL (ref 3.4–10.8)

## 2024-10-07 LAB — LIPID PANEL
Chol/HDL Ratio: 4.9 ratio (ref 0.0–5.0)
Cholesterol, Total: 173 mg/dL (ref 100–199)
HDL: 35 mg/dL — ABNORMAL LOW
LDL Chol Calc (NIH): 119 mg/dL — ABNORMAL HIGH (ref 0–99)
Triglycerides: 105 mg/dL (ref 0–149)
VLDL Cholesterol Cal: 19 mg/dL (ref 5–40)

## 2024-10-07 LAB — PSA TOTAL (REFLEX TO FREE): Prostate Specific Ag, Serum: 0.4 ng/mL (ref 0.0–4.0)

## 2024-10-07 LAB — HEMOGLOBIN A1C
Est. average glucose Bld gHb Est-mCnc: 114 mg/dL
Hgb A1c MFr Bld: 5.6 % (ref 4.8–5.6)

## 2024-10-26 ENCOUNTER — Encounter (HOSPITAL_BASED_OUTPATIENT_CLINIC_OR_DEPARTMENT_OTHER): Payer: Self-pay

## 2024-10-26 ENCOUNTER — Other Ambulatory Visit: Payer: Self-pay

## 2024-10-26 ENCOUNTER — Emergency Department (HOSPITAL_BASED_OUTPATIENT_CLINIC_OR_DEPARTMENT_OTHER)
Admission: EM | Admit: 2024-10-26 | Discharge: 2024-10-26 | Disposition: A | Discharge status: Home / Self Care | Attending: Emergency Medicine | Admitting: Emergency Medicine

## 2024-10-26 ENCOUNTER — Emergency Department (HOSPITAL_BASED_OUTPATIENT_CLINIC_OR_DEPARTMENT_OTHER)

## 2024-10-26 DIAGNOSIS — R1032 Left lower quadrant pain: Secondary | ICD-10-CM | POA: Insufficient documentation

## 2024-10-26 LAB — COMPREHENSIVE METABOLIC PANEL WITH GFR
ALT: 11 U/L (ref 0–44)
AST: 14 U/L — ABNORMAL LOW (ref 15–41)
Albumin: 4.2 g/dL (ref 3.5–5.0)
Alkaline Phosphatase: 68 U/L (ref 38–126)
Anion gap: 11 (ref 5–15)
BUN: 10 mg/dL (ref 6–20)
CO2: 25 mmol/L (ref 22–32)
Calcium: 9 mg/dL (ref 8.9–10.3)
Chloride: 104 mmol/L (ref 98–111)
Creatinine, Ser: 1.18 mg/dL (ref 0.61–1.24)
GFR, Estimated: >60 mL/min
Glucose, Bld: 74 mg/dL (ref 70–99)
Potassium: 3.8 mmol/L (ref 3.5–5.1)
Sodium: 140 mmol/L (ref 135–145)
Total Bilirubin: 0.4 mg/dL (ref 0.0–1.2)
Total Protein: 7 g/dL (ref 6.5–8.1)

## 2024-10-26 LAB — CBC WITH DIFFERENTIAL/PLATELET
Abs Immature Granulocytes: 0.07 10*3/uL (ref 0.00–0.07)
Basophils Absolute: 0.1 10*3/uL (ref 0.0–0.1)
Basophils Relative: 1 %
Eosinophils Absolute: 0.5 10*3/uL (ref 0.0–0.5)
Eosinophils Relative: 4 %
HCT: 41.1 % (ref 39.0–52.0)
Hemoglobin: 13.7 g/dL (ref 13.0–17.0)
Immature Granulocytes: 1 %
Lymphocytes Relative: 24 %
Lymphs Abs: 2.6 10*3/uL (ref 0.7–4.0)
MCH: 28.9 pg (ref 26.0–34.0)
MCHC: 33.3 g/dL (ref 30.0–36.0)
MCV: 86.7 fL (ref 80.0–100.0)
Monocytes Absolute: 0.8 10*3/uL (ref 0.1–1.0)
Monocytes Relative: 7 %
Neutro Abs: 7 10*3/uL (ref 1.7–7.7)
Neutrophils Relative %: 63 %
Platelets: 245 10*3/uL (ref 150–400)
RBC: 4.74 MIL/uL (ref 4.22–5.81)
RDW: 13 % (ref 11.5–15.5)
WBC: 11 10*3/uL — ABNORMAL HIGH (ref 4.0–10.5)
nRBC: 0 % (ref 0.0–0.2)

## 2024-10-26 MED ORDER — IOHEXOL 300 MG/ML  SOLN
100.0000 mL | Freq: Once | INTRAMUSCULAR | Status: AC | PRN
  Administered 2024-10-26: 100 mL via INTRAVENOUS

## 2024-10-26 NOTE — ED Notes (Addendum)
 ED Provider at bedside.

## 2024-10-26 NOTE — Discharge Instructions (Signed)
 You were seen in the emergency department for your groin pain.  Your workup showed that the fat layer in your abdomen had some inflammation which I suspect is related to a hernia in your groin.  We were unable to see the hernia on the scan but I suspect this fat layer may be going in and out of the hernia with your symptoms.  You should follow-up with general surgery as scheduled this week to have your symptoms rechecked.  You can take Tylenol  or Motrin  as needed for pain.  You should avoid heavy lifting or strenuous activities until you are cleared by surgery and your pain improves.  You should return to the emergency department if you have a bulge in your groin that is not going down on its own, you have severe pain or redness of the skin, repetitive vomiting or any other new or concerning symptoms.

## 2024-10-26 NOTE — ED Notes (Signed)
Patient transported to CT and back without event.

## 2024-10-26 NOTE — ED Notes (Signed)
 ED Provider at bedside.

## 2024-10-26 NOTE — ED Triage Notes (Signed)
 L sided pelvic pain for 1 week.  Denies urinary symptoms, NVD, fevers

## 2024-10-26 NOTE — ED Provider Notes (Signed)
 " [facility] Shelton EM[facility]Y DEPARTMENT AT [facility] Provider Note   CSN: [id number] Arrival date & time: 10/26/24  9078     Patient presents with: Pelvic Pain   Randall Shelton is a 44 y.o. male.   Patient is a 44 year old male with past medical history of asthma presenting to the emergency department with left groin pain.  The patient states for the last week he has had groin pain that has been coming and going.  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  He states that he did notice some blue or purple discoloration over his groin that he thought might be an enlarged vein.  He states that the pain has started to improve since being in the ED.  He denies any associated fever, nausea, vomiting, diarrhea or constipation.  He states that he does have pain radiate into his testicle but has no testicular swelling.  He states that he did feel a lump earlier this morning in his groin.  The history is provided by the patient.  Pelvic Pain       Prior to Admission medications  Medication Sig Start Date End Date Taking? Authorizing Provider  Albuterol -Budesonide  (AIRSUPRA ) 90-80 MCG/ACT AERO Inhale 2 Inhalations into the lungs as needed. Maximum 12 puffs/day. 03/18/23   Lorin Norris, MD  budesonide -formoterol  (SYMBICORT ) 160-4.5 MCG/ACT inhaler Inhale 2 puffs into the lungs 2 (two) times daily. 10/06/24   Alvia Selinda PARAS, MD  cyclobenzaprine  (FLEXERIL ) 5 MG tablet Take 1-2 tablets (5-10 mg total) by mouth 3 (three) times daily as needed for muscle spasms. 04/08/24   Matthews, Jason J, MD  ibuprofen  (ADVIL ) 600 MG tablet Take 1 tablet (600 mg total) by mouth every 6 (six) hours as needed. 06/21/23   Corlis Burnard DEL, NP  ipratropium (ATROVENT ) 0.03 % nasal spray Place 2 sprays into both nostrils every 12 (twelve) hours. 09/13/24 10/06/24  Matthews, Jason J, MD  ipratropium-albuterol  (DUONEB)  0.5-2.5 (3) MG/3ML SOLN Take 3 mLs by nebulization every 6 (six) hours as needed. 09/13/24   Alvia Selinda PARAS, MD  loperamide  (IMODIUM ) 2 MG capsule Take 1 capsule (2 mg total) by mouth as needed for diarrhea or loose stools. 10/06/24   Matthews, Jason J, MD    Allergies: Patient has no known allergies.    Review of Systems  Genitourinary:  Positive for pelvic pain.    Updated Vital Signs BP (!) 131/107   Pulse 66   Temp 97.6 F (36.4 C) (Oral)   Resp 18   SpO2 98%   Physical Exam Vitals and nursing note reviewed. Exam conducted with a chaperone present Kai Lunger RN).  Constitutional:      General: He is not in acute distress.    Appearance: Normal appearance.  HENT:     Head: Normocephalic.     Nose: Nose normal.     Mouth/Throat:     Mouth: Mucous membranes are moist.     Pharynx: Oropharynx is clear.  Eyes:     Extraocular Movements: Extraocular movements intact.     Conjunctiva/sclera: Conjunctivae normal.  Cardiovascular:     Rate and Rhythm: Normal rate and regular rhythm.     Heart sounds: Normal heart sounds.  Pulmonary:     Effort: Pulmonary effort is normal.     Breath sounds: Normal breath sounds.  Abdominal:     General: Abdomen  is flat.     Palpations: Abdomen is soft.     Tenderness: There is abdominal tenderness (LLQ). There is no guarding or rebound.     Hernia: No hernia is present.     Comments: No palpation femoral or inguinal hernia on exam, no overlying skin changes in groin  Genitourinary:    Penis: Normal.      Testes: Normal.     Comments: No testicular swelling or tenderness on exam Musculoskeletal:        General: Normal range of motion.     Cervical back: Normal range of motion.  Skin:    General: Skin is warm and dry.  Neurological:     Mental Status: He is alert and oriented to person, place, and time.  Psychiatric:        Mood and Affect: Mood normal.        Behavior: Behavior normal.     (all labs ordered are listed, but  only abnormal results are displayed) Labs Reviewed  COMPREHENSIVE METABOLIC PANEL WITH GFR - Abnormal; Notable for the following components:      Result Value   AST 14 (*)    All other components within normal limits  CBC WITH DIFFERENTIAL/PLATELET - Abnormal; Notable for the following components:   WBC 11.0 (*)    All other components within normal limits    EKG: None  Radiology: CT ABDOMEN PELVIS W CONTRAST Result Date: 10/26/2024 EXAM: CT ABDOMEN AND PELVIS WITH CONTRAST 10/26/2024 11:42:57 AM TECHNIQUE: CT of the abdomen and pelvis was performed with the administration of intravenous contrast. Multiplanar reformatted images are provided for review. Automated exposure control, iterative reconstruction, and/or weight-based adjustment of the mA/kV was utilized to reduce the radiation dose to as low as reasonably achievable. COMPARISON: None available. CLINICAL HISTORY: Left lower quadrant abdominal pain. FINDINGS: LOWER CHEST: No acute abnormality. LIVER: Unchanged small lobular hypodensity in the right hepatic lobe, likely a small cyst or hemangioma. GALLBLADDER AND BILE DUCTS: Decompressed gallbladder. No biliary ductal dilatation. SPLEEN: No acute abnormality. PANCREAS: No acute abnormality. ADRENAL GLANDS: No acute abnormality. KIDNEYS, URETERS AND BLADDER: No stones in the kidneys or ureters. No hydronephrosis. No perinephric or periureteral stranding. The urinary bladder is partially distended, without focal abnormality. GI AND BOWEL: Stomach demonstrates no acute abnormality. There is no bowel obstruction. Small region of inflamed fat in the left lower quadrant just anterior to the descending colon, which could represent subacute epipelvic appendagitis. PERITONEUM AND RETROPERITONEUM: No free pelvic fluid or ascites. No free air. VASCULATURE: Aorta is normal in caliber. Scattered aortoiliac atherosclerosis. LYMPH NODES: No lymphadenopathy. REPRODUCTIVE ORGANS: No prostatomegaly. BONES AND  SOFT TISSUES: Early multilevel degenerative disc disease changes. No acute osseous abnormality. No focal soft tissue abnormality. IMPRESSION: 1. Small region of inflamed fat in the left lower quadrant just anterior to the descending colon, possibly representing subacute epiploic appendagitis. Electronically signed by: Rogelia Myers MD 10/26/2024 12:22 PM EST RP Workstation: [id number]     Procedures   Medications Ordered in the ED  iohexol  (OMNIPAQUE ) 300 MG/ML solution 100 mL (100 mLs Intravenous Contrast Given 10/26/24 1133)    Clinical Course as of 10/26/24 1238  Tue Oct 26, 2024  1228 CTAP showed small area of inflamed fat - possible epiploic appendagitis. Inflammation may also be related to hernia, but no hernia seen. Patient recommended symptomatic treatment and will be given general surgery follow up as needed.  [VK]  1234 Pain is well controlled, states he has general surgery follow up  this week already scheduled for another issue as well. He was given strict return precautions.  [VK]    Clinical Course User Index [VK] Kingsley, Jerri Glauser K, DO                                 Medical Decision Making This patient presents to the ED with chief complaint(s) of L groin pain with pertinent past medical history of asthma which further complicates the presenting complaint. The complaint involves an extensive differential diagnosis and also carries with it a high risk of complications and morbidity.    The differential diagnosis includes hernia, strangulated or incarcerated hernia less likely as no palpable hernia currently on exam, considered diverticulitis or other intra-abdominal infection, no significant tenderness to testicles or swelling making epididymitis, orchitis or torsion less likely, considering nephrolithiasis  Additional history obtained: Additional history obtained from N/A Records reviewed N/A  ED Course and Reassessment: On patient's arrival he is hemodynamically stable  in no acute distress.  The patient's symptoms sound consistent with likely a hernia though no palpable hernia currently present on exam with palpable left lower quadrant tenderness.  The patient will have labs and CT imaging as well as urine to evaluate for etiology of his symptoms.  Declined any pain control at this time and will be closely reassessed.  Independent labs interpretation:  The following labs were independently interpreted: within normal range  Independent visualization of imaging: - I independently visualized the following imaging with scope of interpretation limited to determining acute life threatening conditions related to emergency care: CTAP, which revealed inflammation of fat in LLQ, possible epiploic appendagitis   Consultation: - Consulted or discussed management/test interpretation w/ external professional: N/A  Consideration for admission or further workup: Patient has no emergent conditions requiring admission or further work-up at this time and is stable for discharge home with primary care and general surgery follow-up  Social Determinants of health: N/A    Amount and/or Complexity of Data Reviewed Labs: ordered. Radiology: ordered.  Risk Prescription drug management.       Final diagnoses:  Left groin pain    ED Discharge Orders     None          Ellouise Richerd POUR, DO 10/26/24 1238  "

## 2024-10-28 ENCOUNTER — Ambulatory Visit
Admission: RE | Admit: 2024-10-28 | Discharge: 2024-10-28 | Disposition: A | Discharge status: Home / Self Care | Source: Ambulatory Visit | Attending: General Surgery | Admitting: General Surgery

## 2024-10-28 ENCOUNTER — Encounter: Payer: Self-pay | Admitting: General Surgery

## 2024-10-28 ENCOUNTER — Ambulatory Visit: Payer: Self-pay | Admitting: General Surgery

## 2024-10-28 VITALS — BP 110/68 | HR 78 | Ht 72.0 in | Wt 203.0 lb

## 2024-10-28 DIAGNOSIS — K611 Rectal abscess: Secondary | ICD-10-CM | POA: Diagnosis present

## 2024-10-28 DIAGNOSIS — K4090 Unilateral inguinal hernia, without obstruction or gangrene, not specified as recurrent: Secondary | ICD-10-CM | POA: Diagnosis not present

## 2024-10-28 MED ORDER — AMOXICILLIN-POT CLAVULANATE 875-125 MG PO TABS: 1.0000 | ORAL_TABLET | Freq: Two times a day (BID) | ORAL | 0 refills | Status: AC

## 2024-10-28 MED ORDER — IOHEXOL 300 MG/ML  SOLN
100.0000 mL | Freq: Once | INTRAMUSCULAR | Status: AC | PRN
  Administered 2024-10-28: 100 mL via INTRAVENOUS

## 2024-10-28 NOTE — Patient Instructions (Signed)
 We have you scheduled for a CT scan today at 3:30pm, with arrival at 3:15pm at the Outpatient imaging center

## 2024-10-28 NOTE — Progress Notes (Signed)
 Patient ID: Randall Shelton, male   DOB: September 11, 1981, 44 y.o.   MRN: [id number] CC: Possible perirectal abscess History of Present Illness Randall Shelton is a 44 y.o. male with past medical history as below who presents in consultation for possible perirectal abscess.  The patient reports that several years ago he had a left perirectal abscess and underwent incision and drainage as well as fistulotomy.  He says that several weeks ago he noticed some pain and swelling in his right perirectal space.  He says that he went to his primary care doctor and was prescribed antibiotics.  He does report that it opened up and started to drain purulent fluid.  He says after this he got better.  He actually went to the ED for left groin pain 2 days ago.  Since then he has noticed an increase swelling and pain in his right perianal space.  He denies any fevers or chills.  He says that it has been draining some pink fluid.   Patient also reports that he was in the ED 2 days ago for left inguinal pain.  He says that he got a CT scan because he has been noticing pain in the area.  He says the pain is worse with exertion and movement.  He says that he does get a bulge in his left groin.  He denies any overlying skin changes or obstructive symptoms. Past Medical History Past Medical History:  Diagnosis Date   Asthma    Chronic back pain    Irregular heart beat    Pancreatitis    Perianal abscess 09/22/2024   Proctocolitis        Past Surgical History:  Procedure Laterality Date   BIOPSY  05/20/2023   Procedure: BIOPSY;  Surgeon: Jinny Carmine, MD;  Location: [facility];  Service: Endoscopy;;   COLONOSCOPY WITH PROPOFOL  N/A 05/20/2023   Procedure: COLONOSCOPY WITH PROPOFOL ;  Surgeon: Jinny Carmine, MD;  Location: [facility];  Service: Endoscopy;  Laterality: N/A;   INCISION AND DRAINAGE ABSCESS N/A 08/18/2020   Procedure: INCISION AND DRAINAGE ABSCESS, perirectal abscess;  Surgeon: Lane Shope, MD;  Location:  [facility];  Service: General;  Laterality: N/A;   POLYPECTOMY  05/20/2023   Procedure: POLYPECTOMY;  Surgeon: Jinny Carmine, MD;  Location: [facility];  Service: Endoscopy;;   WISDOM TOOTH EXTRACTION Bilateral 2002    Allergies[1]  Current Outpatient Medications  Medication Sig Dispense Refill   amoxicillin -clavulanate (AUGMENTIN ) 875-125 MG tablet Take 1 tablet by mouth 2 (two) times daily for 5 days. 10 tablet 0   Albuterol -Budesonide  (AIRSUPRA ) 90-80 MCG/ACT AERO Inhale 2 Inhalations into the lungs as needed. Maximum 12 puffs/day. 10.7 g 3   budesonide -formoterol  (SYMBICORT ) 160-4.5 MCG/ACT inhaler Inhale 2 puffs into the lungs 2 (two) times daily. 1 each 3   ibuprofen  (ADVIL ) 600 MG tablet Take 1 tablet (600 mg total) by mouth every 6 (six) hours as needed. 30 tablet 0   ipratropium-albuterol  (DUONEB) 0.5-2.5 (3) MG/3ML SOLN Take 3 mLs by nebulization every 6 (six) hours as needed. 120 mL 3   loperamide  (IMODIUM ) 2 MG capsule Take 1 capsule (2 mg total) by mouth as needed for diarrhea or loose stools. 30 capsule 0   No current facility-administered medications for this visit.    Family History Family History  Problem Relation Age of Onset   COPD Mother        Smoker   Hiatal hernia Mother    Heart failure Father    Heart disease  Father    Hypertension Father    Diverticulitis Father    Bone cancer Father    Seizures Sister    Heart disease Sister    Diabetes Mellitus II Maternal Grandmother    Pneumonia Maternal Grandmother    Cancer Maternal Grandfather    Heart attack Paternal Grandmother    Pneumonia Paternal Grandfather    High Cholesterol Son        Social History Social History[2]   Smoker   ROS Full ROS of systems performed and is otherwise negative there than what is stated in the HPI  Physical Exam Blood pressure 110/68, pulse 78, height 6' (1.829 m), weight 203 lb (92.1 kg), SpO2 99%.  Alert and oriented x 3, normal work of breathing room air,  regular rate and rhythm, abdomen is soft, nontender nondistended, rectal exam performed in the presence of a chaperone.  On the left side there is an area that is well-healed of his previous fistulotomy and incision and drainage.  There is no pain over this area.  There is no fluctuance.  On the right anterior side there is an area of induration.  This is tender to palpation but I do not feel any overt fluctuance.  There is some cellulitic changes of the perianal skin.  I am unable to express any fluid from the area.  On rectal exam there is pain on the right anterior and right lateral rectal wall again without any expression of purulence with palpation.  Patient also has easily reducible left inguinal hernia. Data Reviewed I reviewed his CT scan from Tuesday.  There does seem to be a fat-containing inguinal hernia.  I do not see any undrained fluid collection around the rectum.  I have personally reviewed the patient's imaging and medical records.    Assessment/Plan    Patient with concern for perirectal abscess.  I do not feel any overt fluctuance and I think that we should send him for CT scan to see if there is an undrained fluid collection.  For now he he needs to continue sitz bath's and warm compress as needed.  I will represcribe him a course of antibiotics.  We will follow-up on the CT scan.  He does have a left inguinal hernia.  I discussed with him that he does have symptoms from it and that once we figure out what is going on with his rectal pain then we could talk about repair of this on an elective basis.  He agrees to the affirmation plan.    Randall Shelton 10/28/2024, 1:31 PM     [1] No Known Allergies [2]  Social History Tobacco Use   Smoking status: Every Day    Current packs/day: 1.00    Average packs/day: 1 pack/day for 25.0 years (25.0 ttl pk-yrs)    Types: Cigarettes   Smokeless tobacco: Never  Vaping Use   Vaping status: Former  Substance Use Topics   Alcohol  use: Yes   Drug use: Yes    Types: Marijuana

## 2024-11-09 ENCOUNTER — Ambulatory Visit: Admitting: General Surgery

## 2024-11-16 ENCOUNTER — Ambulatory Visit

## 2025-10-07 ENCOUNTER — Encounter: Admitting: Family Medicine
